# Patient Record
Sex: Female | Born: 1990 | Hispanic: No | Marital: Married | Smoking: Never smoker
Health system: Southern US, Community
[De-identification: ages and names within clinical notes are randomized; demographics above are authoritative.]

## PROBLEM LIST (undated history)

## (undated) ENCOUNTER — Inpatient Hospital Stay (HOSPITAL_COMMUNITY): Payer: Self-pay

## (undated) DIAGNOSIS — I00 Rheumatic fever without heart involvement: Secondary | ICD-10-CM

## (undated) DIAGNOSIS — D649 Anemia, unspecified: Secondary | ICD-10-CM

## (undated) HISTORY — PX: TONSILLECTOMY: SUR1361

---

## 2013-07-15 ENCOUNTER — Ambulatory Visit (INDEPENDENT_AMBULATORY_CARE_PROVIDER_SITE_OTHER): Payer: BC Managed Care – PPO | Admitting: Emergency Medicine

## 2013-07-15 VITALS — BP 104/68 | HR 68 | Temp 97.9°F | Resp 18 | Ht 67.0 in | Wt 141.0 lb

## 2013-07-15 DIAGNOSIS — S335XXA Sprain of ligaments of lumbar spine, initial encounter: Secondary | ICD-10-CM

## 2013-07-15 MED ORDER — CYCLOBENZAPRINE HCL 10 MG PO TABS: 10.0000 mg | ORAL_TABLET | Freq: Three times a day (TID) | ORAL | Status: DC | PRN

## 2013-07-15 MED ORDER — NAPROXEN SODIUM 550 MG PO TABS: 550.0000 mg | ORAL_TABLET | Freq: Two times a day (BID) | ORAL | Status: DC

## 2013-07-15 NOTE — Progress Notes (Signed)
[facility] [address], [place] [address] [phone]  Date:  07/15/2013   Name:  Kelsey LevansChaimae Monfils   DOB:  09/30/1990   MRN:  [id number]  PCP:  No primary provider on file.    Chief Complaint: Back Pain   History of Present Illness:  Kelsey LevansChaimae Carey is a 23 y.o. very pleasant female patient who presents with the following:  1 week history of low back pain across lumbar region.  No history of injury or overuse.  No radicular symptoms or neuro symptoms.  Pain worse when sitting, bending, or standing less when lays down.  No prior back injury.  No improvement with over the counter medications or other home remedies. Denies other complaint or health concern today.   There are no active problems to display for this patient.   No past medical history on file.  No past surgical history on file.  History  Substance Use Topics  . Smoking status: Never Smoker   . Smokeless tobacco: Never Used  . Alcohol Use: No    No family history on file.  No Known Allergies  Medication list has been reviewed and updated.  No current outpatient prescriptions on file prior to visit.   No current facility-administered medications on file prior to visit.    Review of Systems:  As per HPI, otherwise negative.    Physical Examination: Filed Vitals:   07/15/13 1820  BP: 104/68  Pulse: 68  Temp: 97.9 F (36.6 C)  Resp: 18   Filed Vitals:   07/15/13 1820  Height: 5\' 7"  (1.702 m)  Weight: 141 lb (63.957 kg)   Body mass index is 22.08 kg/(m^2). Ideal Body Weight: Weight in (lb) to have BMI = 25: 159.3  GEN: WDWN, NAD, Non-toxic, A & O x 3 HEENT: Atraumatic, Normocephalic. Neck supple. No masses, No LAD. Ears and Nose: No external deformity. CV: RRR, No M/G/R. No JVD. No thrill. No extra heart sounds. PULM: CTA B, no wheezes, crackles, rhonchi. No retractions. No resp. distress. No accessory muscle use. ABD: S, NT, ND, +BS. No rebound. No HSM. BACK:  Tender  bilaterally lumbar paraspinous.  Neuro intact EXTR: No c/c/e NEURO Normal gait.  PSYCH: Normally interactive. Conversant. Not depressed or anxious appearing.  Calm demeanor.    Assessment and Plan: Lumbar strain Anaprox Flexeril   Signed,  Phillips OdorJeffery Anderson, MD

## 2013-07-15 NOTE — Patient Instructions (Signed)
Lumbosacral Strain Lumbosacral strain is a strain of any of the parts that make up your lumbosacral vertebrae. Your lumbosacral vertebrae are the bones that make up the lower third of your backbone. Your lumbosacral vertebrae are held together by muscles and tough, fibrous tissue (ligaments).  CAUSES  A sudden blow to your back can cause lumbosacral strain. Also, anything that causes an excessive stretch of the muscles in the low back can cause this strain. This is typically seen when people exert themselves strenuously, fall, lift heavy objects, bend, or crouch repeatedly. RISK FACTORS  Physically demanding work.  Participation in pushing or pulling sports or sports that require sudden twist of the back (tennis, golf, baseball).  Weight lifting.  Excessive lower back curvature.  Forward-tilted pelvis.  Weak back or abdominal muscles or both.  Tight hamstrings. SIGNS AND SYMPTOMS  Lumbosacral strain may cause pain in the area of your injury or pain that moves (radiates) down your leg.  DIAGNOSIS Your health care provider can often diagnose lumbosacral strain through a physical exam. In some cases, you may need tests such as X-ray exams.  TREATMENT  Treatment for your lower back injury depends on many factors that your clinician will have to evaluate. However, most treatment will include the use of anti-inflammatory medicines. HOME CARE INSTRUCTIONS   Avoid hard physical activities (tennis, racquetball, waterskiing) if you are not in proper physical condition for it. This may aggravate or create problems.  If you have a back problem, avoid sports requiring sudden body movements. Swimming and walking are generally safer activities.  Maintain good posture.  Maintain a healthy weight.  For acute conditions, you may put ice on the injured area.  Put ice in a plastic bag.  Place a towel between your skin and the bag.  Leave the ice on for 20 minutes, 2 3 times a day.  When the  low back starts healing, stretching and strengthening exercises may be recommended. SEEK MEDICAL CARE IF:  Your back pain is getting worse.  You experience severe back pain not relieved with medicines. SEEK IMMEDIATE MEDICAL CARE IF:   You have numbness, tingling, weakness, or problems with the use of your arms or legs.  There is a change in bowel or bladder control.  You have increasing pain in any area of the body, including your belly (abdomen).  You notice shortness of breath, dizziness, or feel faint.  You feel sick to your stomach (nauseous), are throwing up (vomiting), or become sweaty.  You notice discoloration of your toes or legs, or your feet get very cold. MAKE SURE YOU:   Understand these instructions.  Will watch your condition.  Will get help right away if you are not doing well or get worse. Document Released: 01/12/2005 Document Revised: 01/23/2013 Document Reviewed: 11/21/2012 ExitCare Patient Information 2014 [employer].  

## 2013-11-20 ENCOUNTER — Inpatient Hospital Stay (HOSPITAL_COMMUNITY)
Admission: AD | Admit: 2013-11-20 | Discharge: 2013-11-20 | Disposition: A | Discharge status: Home / Self Care | Payer: BC Managed Care – PPO | Source: Ambulatory Visit | Attending: Obstetrics & Gynecology | Admitting: Obstetrics & Gynecology

## 2013-11-20 ENCOUNTER — Inpatient Hospital Stay (HOSPITAL_COMMUNITY): Payer: BC Managed Care – PPO

## 2013-11-20 ENCOUNTER — Encounter (HOSPITAL_COMMUNITY): Payer: Self-pay | Admitting: *Deleted

## 2013-11-20 DIAGNOSIS — O209 Hemorrhage in early pregnancy, unspecified: Secondary | ICD-10-CM | POA: Diagnosis not present

## 2013-11-20 DIAGNOSIS — O4690 Antepartum hemorrhage, unspecified, unspecified trimester: Secondary | ICD-10-CM | POA: Diagnosis not present

## 2013-11-20 DIAGNOSIS — R109 Unspecified abdominal pain: Secondary | ICD-10-CM | POA: Insufficient documentation

## 2013-11-20 LAB — HCG, QUANTITATIVE, PREGNANCY: hCG, Beta Chain, Quant, S: 54 m[IU]/mL — ABNORMAL HIGH (ref <5)

## 2013-11-20 LAB — CBC
HCT: 37.2 % (ref 36.0–46.0)
Hemoglobin: 12 g/dL (ref 12.0–15.0)
MCH: 25.4 pg — ABNORMAL LOW (ref 26.0–34.0)
MCHC: 32.3 g/dL (ref 30.0–36.0)
MCV: 78.8 fL (ref 78.0–100.0)
Platelets: 274 10*3/uL (ref 150–400)
RBC: 4.72 MIL/uL (ref 3.87–5.11)
RDW: 14.2 % (ref 11.5–15.5)
WBC: 10 10*3/uL (ref 4.0–10.5)

## 2013-11-20 LAB — URINALYSIS, ROUTINE W REFLEX MICROSCOPIC
BILIRUBIN URINE: NEGATIVE
Glucose, UA: NEGATIVE mg/dL
Ketones, ur: NEGATIVE mg/dL
NITRITE: NEGATIVE
Protein, ur: NEGATIVE mg/dL
Specific Gravity, Urine: 1.01 (ref 1.005–1.030)
UROBILINOGEN UA: 0.2 mg/dL (ref 0.0–1.0)
pH: 7.5 (ref 5.0–8.0)

## 2013-11-20 LAB — URINE MICROSCOPIC-ADD ON

## 2013-11-20 LAB — WET PREP, GENITAL
Clue Cells Wet Prep HPF POC: NONE SEEN
Trich, Wet Prep: NONE SEEN
Yeast Wet Prep HPF POC: NONE SEEN

## 2013-11-20 LAB — POCT PREGNANCY, URINE: Preg Test, Ur: POSITIVE — AB

## 2013-11-20 MED ORDER — OXYCODONE-ACETAMINOPHEN 5-325 MG PO TABS: 1.0000 | ORAL_TABLET | Freq: Four times a day (QID) | ORAL | Status: DC | PRN

## 2013-11-20 NOTE — MAU Note (Signed)
Pt"s husband states pt found out that she is bleeding about 3 wks ago and today she is bleeding. Pt states she is having. Pt state pain is about 9

## 2013-11-20 NOTE — Discharge Instructions (Signed)
Vaginal Bleeding During Pregnancy, First Trimester  A small amount of bleeding (spotting) from the vagina is relatively common in early pregnancy. It usually stops on its own. Various things may cause bleeding or spotting in early pregnancy. Some bleeding may be related to the pregnancy, and some may not. In most cases, the bleeding is normal and is not a problem. However, bleeding can also be a sign of something serious. Be sure to tell your health care provider about any vaginal bleeding right away.  Some possible causes of vaginal bleeding during the first trimester include:  · Infection or inflammation of the cervix.  · Growths (polyps) on the cervix.  · Miscarriage or threatened miscarriage.  · Pregnancy tissue has developed outside of the uterus and in a fallopian tube (tubal pregnancy).  · Tiny cysts have developed in the uterus instead of pregnancy tissue (molar pregnancy).  HOME CARE INSTRUCTIONS   Watch your condition for any changes. The following actions may help to lessen any discomfort you are feeling:  · Follow your health care provider's instructions for limiting your activity. If your health care provider orders bed rest, you may need to stay in bed and only get up to use the bathroom. However, your health care provider may allow you to continue light activity.  · If needed, make plans for someone to help with your regular activities and responsibilities while you are on bed rest.  · Keep track of the number of pads you use each day, how often you change pads, and how soaked (saturated) they are. Write this down.  · Do not use tampons. Do not douche.  · Do not have sexual intercourse or orgasms until approved by your health care provider.  · If you pass any tissue from your vagina, save the tissue so you can show it to your health care provider.  · Only take over-the-counter or prescription medicines as directed by your health care provider.  · Do not take aspirin because it can make you  bleed.  · Keep all follow-up appointments as directed by your health care provider.  SEEK MEDICAL CARE IF:  · You have any vaginal bleeding during any part of your pregnancy.  · You have cramps or labor pains.  · You have a fever, not controlled by medicine.  SEEK IMMEDIATE MEDICAL CARE IF:   · You have severe cramps in your back or belly (abdomen).  · You pass large clots or tissue from your vagina.  · Your bleeding increases.  · You feel light-headed or weak, or you have fainting episodes.  · You have chills.  · You are leaking fluid or have a gush of fluid from your vagina.  · You pass out while having a bowel movement.  MAKE SURE YOU:  · Understand these instructions.  · Will watch your condition.  · Will get help right away if you are not doing well or get worse.  Document Released: 01/12/2005 Document Revised: 04/09/2013 Document Reviewed: 12/10/2012  ExitCare® Patient Information ©2015 [employer]. This information is not intended to replace advice given to you by your health care provider. Make sure you discuss any questions you have with your health care provider.

## 2013-11-20 NOTE — MAU Note (Signed)
Pt had positive HPT. Started having bleeding and cramping this morning.

## 2013-11-20 NOTE — MAU Provider Note (Signed)
Chief Complaint: Vaginal Bleeding and Abdominal Pain   First Provider Initiated Contact with Patient 11/20/13 1547     SUBJECTIVE HPI: Kelsey [name] is a 23 y.o. G1P0 at [redacted]w[redacted]d by LMP who presents to maternity admissions reporting heavy vaginal bleeding and cramping starting today and positive pregnancy test 1 week ago.  Patient's last menstrual period was 10/14/2013.  She is sure of this date.  She is changing her pad every 2-3 hours with bleeding today.  She denies vaginal itching/burning, urinary symptoms, h/a, dizziness, n/v, or fever/chills.     Past Medical History  Diagnosis Date  . Medical history non-contributory    Past Surgical History  Procedure Laterality Date  . Tonsillectomy     History   Social History  . Marital Status: Married    Spouse Name: N/A    Number of Children: N/A  . Years of Education: N/A   Occupational History  . Not on file.   Social History Main Topics  . Smoking status: Never Smoker   . Smokeless tobacco: Never Used  . Alcohol Use: No  . Drug Use: No  . Sexual Activity: Not on file   Other Topics Concern  . Not on file   Social History Narrative  . No narrative on file   No current facility-administered medications on file prior to encounter.   No current outpatient prescriptions on file prior to encounter.   No Known Allergies  ROS: Pertinent items in HPI  OBJECTIVE Blood pressure 119/81, pulse 95, temperature 98.8 F (37.1 C), temperature source Oral, height 5\' 7"  (1.702 m), weight 65.862 kg (145 lb 3.2 oz), last menstrual period 10/14/2013. GENERAL: Well-developed, well-nourished female in no acute distress.  HEENT: Normocephalic HEART: normal rate RESP: normal effort ABDOMEN: Soft, non-tender EXTREMITIES: Nontender, no edema NEURO: Alert and oriented Pelvic exam: Cervix pink, visually closed, without lesion, small amount dark red bleeding with small clots, vaginal walls and external genitalia normal Bimanual exam: Cervix  0/long/high, firm, anterior, neg CMT, uterus nontender, nonenlarged, adnexa without tenderness, enlargement, or mass  LAB RESULTS Results for orders placed during the hospital encounter of 11/20/13 (from the past 24 hour(s))  URINALYSIS, ROUTINE W REFLEX MICROSCOPIC     Status: Abnormal   Collection Time    11/20/13  2:10 PM      Result Value Ref Range   Color, Urine YELLOW  YELLOW   APPearance HAZY (*) CLEAR   Specific Gravity, Urine 1.010  1.005 - 1.030   pH 7.5  5.0 - 8.0   Glucose, UA NEGATIVE  NEGATIVE mg/dL   Hgb urine dipstick LARGE (*) NEGATIVE   Bilirubin Urine NEGATIVE  NEGATIVE   Ketones, ur NEGATIVE  NEGATIVE mg/dL   Protein, ur NEGATIVE  NEGATIVE mg/dL   Urobilinogen, UA 0.2  0.0 - 1.0 mg/dL   Nitrite NEGATIVE  NEGATIVE   Leukocytes, UA TRACE (*) NEGATIVE  URINE MICROSCOPIC-ADD ON     Status: Abnormal   Collection Time    11/20/13  2:10 PM      Result Value Ref Range   Squamous Epithelial / LPF MANY (*) RARE   Urine-Other FIELD OBSCURED BY RBC'S    POCT PREGNANCY, URINE     Status: Abnormal   Collection Time    11/20/13  2:21 PM      Result Value Ref Range   Preg Test, Ur POSITIVE (*) NEGATIVE  WET PREP, GENITAL     Status: Abnormal   Collection Time    11/20/13  3:55  PM      Result Value Ref Range   Yeast Wet Prep HPF POC NONE SEEN  NONE SEEN   Trich, Wet Prep NONE SEEN  NONE SEEN   Clue Cells Wet Prep HPF POC NONE SEEN  NONE SEEN   WBC, Wet Prep HPF POC FEW (*) NONE SEEN  CBC     Status: Abnormal   Collection Time    11/20/13  4:01 PM      Result Value Ref Range   WBC 10.0  4.0 - 10.5 K/uL   RBC 4.72  3.87 - 5.11 MIL/uL   Hemoglobin 12.0  12.0 - 15.0 g/dL   HCT 16.137.2  09.636.0 - 04.546.0 %   MCV 78.8  78.0 - 100.0 fL   MCH 25.4 (*) 26.0 - 34.0 pg   MCHC 32.3  30.0 - 36.0 g/dL   RDW 40.914.2  81.111.5 - 91.415.5 %   Platelets 274  150 - 400 K/uL  HCG, QUANTITATIVE, PREGNANCY     Status: Abnormal   Collection Time    11/20/13  4:01 PM      Result Value Ref Range   hCG,  Beta Chain, Quant, S 54 (*) <5 mIU/mL    IMAGING [place] Ob Comp Less 14 Wks  11/20/2013   CLINICAL DATA:  Early pregnancy with vaginal bleeding.  EXAM: OBSTETRIC <14 WK ULTRASOUND  TECHNIQUE: Transabdominal ultrasound was performed for evaluation of the gestation as well as the maternal uterus and adnexal regions.  COMPARISON:  No priors.  FINDINGS: Intrauterine gestational sac: None.  Yolk sac:  None.  Embryo:  None.  Cardiac Activity: None.  Heart Rate: N/A  Maternal uterus/adnexae: Bilateral ovaries are normal in echotexture and appearance with multiple small follicles. No significant free fluid within the cul-de-sac.  IMPRESSION: 1. No IUP identified. 2. No acute findings.   Electronically Signed   By: Trudie Reedaniel  Entrikin M.D.   On: 11/20/2013 16:49   [place] Ob Transvaginal  11/20/2013   CLINICAL DATA:  Early pregnancy with vaginal bleeding.  EXAM: OBSTETRIC <14 WK ULTRASOUND  TECHNIQUE: Transabdominal ultrasound was performed for evaluation of the gestation as well as the maternal uterus and adnexal regions.  COMPARISON:  No priors.  FINDINGS: Intrauterine gestational sac: None.  Yolk sac:  None.  Embryo:  None.  Cardiac Activity: None.  Heart Rate: N/A  Maternal uterus/adnexae: Bilateral ovaries are normal in echotexture and appearance with multiple small follicles. No significant free fluid within the cul-de-sac.  IMPRESSION: 1. No IUP identified. 2. No acute findings.   Electronically Signed   By: Trudie Reedaniel  Entrikin M.D.   On: 11/20/2013 16:49    ASSESSMENT 1. Vaginal bleeding in pregnancy, first trimester     PLAN Discharge home with ectopic precautions Return to MAU in 48 hours for repeat quant hcg Percocet 5/325, take 1-2 tabs Q 6 hours PRN x10 tabs Return to MAU as needed for emergencies    Medication List    Notice   You have not been prescribed any medications.     Follow-up Information   [facility] In 2 days. (Return to MAU on  Friday for repeat pregnancy lab.  Return sooner as needed for emergencies. )    Contact information:   [id number] [place] [address] [phone]      Sharen CounterLisa Leftwich-Kirby Certified Nurse-Midwife 11/20/2013  5:24 PM

## 2013-11-20 NOTE — Progress Notes (Signed)
Pt states she has a headache that she rates a 7 

## 2013-11-21 LAB — GC/CHLAMYDIA PROBE AMP
CT Probe RNA: NEGATIVE
GC Probe RNA: NEGATIVE

## 2013-11-21 NOTE — MAU Provider Note (Signed)
Attestation of Attending Supervision of Advanced Practitioner (CNM/NP): Evaluation and management procedures were performed by the Advanced Practitioner under my supervision and collaboration.  I have reviewed the Advanced Practitioner's note and chart, and I agree with the management and plan.  [name], [name] 9:04 AM     

## 2013-11-22 ENCOUNTER — Inpatient Hospital Stay (HOSPITAL_COMMUNITY)
Admission: AD | Admit: 2013-11-22 | Discharge: 2013-11-22 | Disposition: A | Discharge status: Home / Self Care | Payer: BC Managed Care – PPO | Source: Ambulatory Visit | Attending: Obstetrics & Gynecology | Admitting: Obstetrics & Gynecology

## 2013-11-22 ENCOUNTER — Encounter (HOSPITAL_COMMUNITY): Payer: Self-pay

## 2013-11-22 DIAGNOSIS — O039 Complete or unspecified spontaneous abortion without complication: Secondary | ICD-10-CM | POA: Insufficient documentation

## 2013-11-22 DIAGNOSIS — R109 Unspecified abdominal pain: Secondary | ICD-10-CM | POA: Insufficient documentation

## 2013-11-22 DIAGNOSIS — M545 Low back pain, unspecified: Secondary | ICD-10-CM | POA: Insufficient documentation

## 2013-11-22 LAB — HCG, QUANTITATIVE, PREGNANCY: hCG, Beta Chain, Quant, S: 8 m[IU]/mL — ABNORMAL HIGH (ref <5)

## 2013-11-22 MED ORDER — PRENATAL PLUS 27-1 MG PO TABS
1.0000 | ORAL_TABLET | Freq: Every day | ORAL | Status: DC
Start: 2013-11-22 — End: 2014-11-10

## 2013-11-22 NOTE — Discharge Instructions (Signed)
Miscarriage A miscarriage is the sudden loss of an unborn baby (fetus) before the 20th week of pregnancy. Most miscarriages happen in the first 3 months of pregnancy. Sometimes, it happens before a woman even knows she is pregnant. A miscarriage is also called a "spontaneous miscarriage" or "early pregnancy loss." Having a miscarriage can be an emotional experience. Talk with your caregiver about any questions you may have about miscarrying, the grieving process, and your future pregnancy plans. CAUSES   Problems with the fetal chromosomes that make it impossible for the baby to develop normally. Problems with the baby's genes or chromosomes are most often the result of errors that occur, by chance, as the embryo divides and grows. The problems are not inherited from the parents.  Infection of the cervix or uterus.   Hormone problems.   Problems with the cervix, such as having an incompetent cervix. This is when the tissue in the cervix is not strong enough to hold the pregnancy.   Problems with the uterus, such as an abnormally shaped uterus, uterine fibroids, or congenital abnormalities.   Certain medical conditions.   Smoking, drinking alcohol, or taking illegal drugs.   Trauma.  Often, the cause of a miscarriage is unknown.  SYMPTOMS   Vaginal bleeding or spotting, with or without cramps or pain.  Pain or cramping in the abdomen or lower back.  Passing fluid, tissue, or blood clots from the vagina. DIAGNOSIS  Your caregiver will perform a physical exam. You may also have an ultrasound to confirm the miscarriage. Blood or urine tests may also be ordered. TREATMENT   Sometimes, treatment is not necessary if you naturally pass all the fetal tissue that was in the uterus. If some of the fetus or placenta remains in the body (incomplete miscarriage), tissue left behind may become infected and must be removed. Usually, a dilation and curettage (D and C) procedure is performed.  During a D and C procedure, the cervix is widened (dilated) and any remaining fetal or placental tissue is gently removed from the uterus.  Antibiotic medicines are prescribed if there is an infection. Other medicines may be given to reduce the size of the uterus (contract) if there is a lot of bleeding.  If you have Rh negative blood and your baby was Rh positive, you will need a Rh immunoglobulin shot. This shot will protect any future baby from having Rh blood problems in future pregnancies. HOME CARE INSTRUCTIONS   Your caregiver may order bed rest or may allow you to continue light activity. Resume activity as directed by your caregiver.  Have someone help with home and family responsibilities during this time.   Keep track of the number of sanitary pads you use each day and how soaked (saturated) they are. Write down this information.   Do not use tampons. Do not douche or have sexual intercourse until approved by your caregiver.   Only take over-the-counter or prescription medicines for pain or discomfort as directed by your caregiver.   Do not take aspirin. Aspirin can cause bleeding.   Keep all follow-up appointments with your caregiver.   If you or your partner have problems with grieving, talk to your caregiver or seek counseling to help cope with the pregnancy loss. Allow enough time to grieve before trying to get pregnant again.  SEEK IMMEDIATE MEDICAL CARE IF:   You have severe cramps or pain in your back or abdomen.  You have a fever.  You pass large blood clots (walnut-sized   or larger) ortissue from your vagina. Save any tissue for your caregiver to inspect.   Your bleeding increases.   You have a thick, bad-smelling vaginal discharge.  You become lightheaded, weak, or you faint.   You have chills.  MAKE SURE YOU:  Understand these instructions.  Will watch your condition.  Will get help right away if you are not doing well or get  worse. Document Released: 09/28/2000 Document Revised: 07/30/2012 Document Reviewed: 05/24/2011 ExitCare Patient Information 2015 [employer]. This information is not intended to replace advice given to you by your health care provider. Make sure you discuss any questions you have with your health care provider.  

## 2013-11-22 NOTE — MAU Provider Note (Signed)
Attestation of Attending Supervision of Advanced Practitioner (CNM/NP): Evaluation and management procedures were performed by the Advanced Practitioner under my supervision and collaboration. I have reviewed the Advanced Practitioner's note and chart, and I agree with the management and plan.  Taralynn Quiett H. 7:51 PM

## 2013-11-22 NOTE — MAU Provider Note (Signed)
Chief Complaint: Follow-up      SUBJECTIVE HPI: Kelsey [name] is a 23 y.o. G1P0 at [redacted]w[redacted]d by LMP who presents for F/U quant. She presented to MAU 2 days ago with a one-day history of bleeding heavier than a normal period. Since then bleeding has diminished and is pink in color. She continues to have mild lower abdominal cramping radiating to low back.   Past Medical History  Diagnosis Date  . Medical history non-contributory    OB History  Gravida Para Term Preterm AB SAB TAB Ectopic Multiple Living  1             # Outcome Date GA Lbr Len/2nd Weight Sex Delivery Anes PTL Lv  1 GRA              Comments: System Generated. Please review and update pregnancy details.     Past Surgical History  Procedure Laterality Date  . Tonsillectomy     History   Social History  . Marital Status: Married    Spouse Name: N/A    Number of Children: N/A  . Years of Education: N/A   Occupational History  . Not on file.   Social History Main Topics  . Smoking status: Never Smoker   . Smokeless tobacco: Never Used  . Alcohol Use: No  . Drug Use: No  . Sexual Activity: Not on file   Other Topics Concern  . Not on file   Social History Narrative  . No narrative on file   No current facility-administered medications on file prior to encounter.   No current outpatient prescriptions on file prior to encounter.   No Known Allergies  ROS: Pertinent items in HPI  OBJECTIVE Blood pressure 120/76, pulse 104, temperature 98.7 F (37.1 C), temperature source Oral, resp. rate 16, last menstrual period 10/14/2013, SpO2 100.00%, unknown if currently breastfeeding. GENERAL: Well-developed, well-nourished female in no acute distress.  HEENT: Normocephalic HEART: normal rate RESP: normal effort ABDOMEN: Soft, non-tender EXTREMITIES: Nontender, no edema NEURO: Alert and oriented SPECULUM EXAM: NEFG, physiologic discharge, no blood noted, cervix clean BIMANUAL: cervix ; uterus normal size, no  adnexal tenderness or masses  LAB RESULTS Results for orders placed during the hospital encounter of 11/22/13 (from the past 24 hour(s))  HCG, QUANTITATIVE, PREGNANCY     Status: Abnormal   Collection Time    11/22/13  5:47 PM      Result Value Ref Range   hCG, Beta Chain, Quant, S 8 (*) <5 mIU/mL    Ref Range 5:47 PM  2d ago      hCG, Beta Chain, Quant, S <5 mIU/mL 8 (H)    54 (H) CM                Ob Transvaginal  11/20/2013   CLINICAL DATA:  Early pregnancy with vaginal bleeding.  EXAM: OBSTETRIC <14 WK ULTRASOUND  TECHNIQUE: Transabdominal ultrasound was performed for evaluation of the gestation as well as the maternal uterus and adnexal regions.  COMPARISON:  No priors.  FINDINGS: Intrauterine gestational sac: None.  Yolk sac:  None.  Embryo:  None.  Cardiac Activity: None.  Heart Rate: N/A  Maternal uterus/adnexae: Bilateral ovaries are normal in echotexture and appearance with multiple small follicles. No significant free fluid within the cul-de-sac.  IMPRESSION: 1. No IUP identified. 2. No acute findings.   Electronically Signed   By: Trudie Reed M.D.   On: 11/20/2013 16:49    MAU COURSE  ASSESSMENT 1. SAB (spontaneous  abortion)   Probably completed very early SAB   PLAN Discharge home Declined contraception so advised to abstain or use condoms until GYN visit. Keep menstrual calendar. Take PNV.     Medication List    STOP taking these medications       oxyCODONE-acetaminophen 5-325 MG per tablet  Commonly known as:  PERCOCET/ROXICET      TAKE these medications       prenatal vitamin w/FE, FA 27-1 MG Tabs tablet  Take 1 tablet by mouth daily.       Follow-up Information   Follow up with [facility]. (Someone from Clinic will call you with appt.)    Contact information:   [address] [place] [address] [phone]         Danae OrleansDeirdre C Poe, CNM 11/22/2013  7:28 PM

## 2013-11-22 NOTE — MAU Note (Signed)
Patient to MAU for repeat BHCG. States she continues to have pink bleeding all the time and abdominal and back pain.

## 2014-02-17 ENCOUNTER — Encounter (HOSPITAL_COMMUNITY): Payer: Self-pay

## 2014-02-27 LAB — OB RESULTS CONSOLE HGB/HCT, BLOOD
HCT: 36 %
Hemoglobin: 11.5 g/dL

## 2014-02-27 LAB — OB RESULTS CONSOLE GC/CHLAMYDIA
Chlamydia: NEGATIVE
GC PROBE AMP, GENITAL: NEGATIVE

## 2014-02-27 LAB — OB RESULTS CONSOLE HEPATITIS B SURFACE ANTIGEN: HEP B S AG: NEGATIVE

## 2014-02-27 LAB — OB RESULTS CONSOLE PLATELET COUNT: Platelets: 315 10*3/uL

## 2014-02-27 LAB — OB RESULTS CONSOLE ABO/RH: RH TYPE: POSITIVE

## 2014-02-27 LAB — OB RESULTS CONSOLE ANTIBODY SCREEN: ANTIBODY SCREEN: NEGATIVE

## 2014-02-27 LAB — OB RESULTS CONSOLE RPR: RPR: NONREACTIVE

## 2014-02-27 LAB — OB RESULTS CONSOLE RUBELLA ANTIBODY, IGM: Rubella: IMMUNE

## 2014-02-27 LAB — OB RESULTS CONSOLE HIV ANTIBODY (ROUTINE TESTING): HIV: NONREACTIVE

## 2014-04-18 NOTE — L&D Delivery Note (Signed)
Vaginal Delivery Note The pt utilized an epidural as pain management.   Spontaneous rupture of membranes today, at 0220, clear.  GBS was negative.  Cervical dilation was complete at  0634.  NICHD Category 1.    Pushing with guidance began at  0724.   After 26 minutes of pushing the head, shoulders and the body of a viable female infant "S[place]on" delivered spontaneously with maternal effort in the ROA position at 0750. Loose [place] x 1, foot x1 easily reduced.  With vigorous tone and spontaneous cry, the infant was placed on moms abd.  After the umbilical cord was clamped it was cut by the FOB, then cord blood was obtained for evaluation.  Spontaneous delivery of a intact placenta with a 3 vessel cord via [place] at  [phone].   Episiotomy: None   The vulva, perineum, vaginal vault, rectum and cervix were inspected and revealed a sulcal tear which was repair using a 3-0 vicryl on a CT needle, a superficial bilateral inner labial laceration which was repaired using a 4-0 vicryl on a SH  Lidocaine was not used, the epidural was sufficient for the repair.   Postpartum pitocin as ordered.  Fundus firm, lochia minimum, bleeding under control.  EBL 300, QBL pending, Pt hemodynamically stable.   Sponge, laps and needle count correct and verified with the primary care nurse.  Attending MD available at all times.    Routine postpartum orders   Mother unsure about method of contraception  Infant to have in patient circumcision   Placenta to pathology: NO     Cord Gases sent to lab: NO Cord blood sent to lab: YES   APGARS:  9 at 1 minute and 9 at 5 minutes Weight:. 7lbs 9.8oz     Both mom and baby were left in stable condition, baby skin to skin      Kelsey [name], CNM, MSN 11/10/2014. 8:45 AM

## 2014-05-03 ENCOUNTER — Emergency Department (HOSPITAL_COMMUNITY): Payer: BLUE CROSS/BLUE SHIELD

## 2014-05-03 ENCOUNTER — Emergency Department (HOSPITAL_COMMUNITY)
Admission: EM | Admit: 2014-05-03 | Discharge: 2014-05-03 | Disposition: A | Discharge status: Home / Self Care | Payer: BLUE CROSS/BLUE SHIELD | Attending: Emergency Medicine | Admitting: Emergency Medicine

## 2014-05-03 ENCOUNTER — Encounter (HOSPITAL_COMMUNITY): Payer: Self-pay | Admitting: *Deleted

## 2014-05-03 DIAGNOSIS — O99611 Diseases of the digestive system complicating pregnancy, first trimester: Secondary | ICD-10-CM | POA: Insufficient documentation

## 2014-05-03 DIAGNOSIS — O9989 Other specified diseases and conditions complicating pregnancy, childbirth and the puerperium: Secondary | ICD-10-CM | POA: Diagnosis present

## 2014-05-03 DIAGNOSIS — K5900 Constipation, unspecified: Secondary | ICD-10-CM

## 2014-05-03 DIAGNOSIS — R12 Heartburn: Secondary | ICD-10-CM

## 2014-05-03 DIAGNOSIS — M549 Dorsalgia, unspecified: Secondary | ICD-10-CM | POA: Diagnosis not present

## 2014-05-03 DIAGNOSIS — Z8679 Personal history of other diseases of the circulatory system: Secondary | ICD-10-CM | POA: Insufficient documentation

## 2014-05-03 DIAGNOSIS — Z79899 Other long term (current) drug therapy: Secondary | ICD-10-CM | POA: Insufficient documentation

## 2014-05-03 DIAGNOSIS — Z3A13 13 weeks gestation of pregnancy: Secondary | ICD-10-CM | POA: Diagnosis not present

## 2014-05-03 DIAGNOSIS — R0789 Other chest pain: Secondary | ICD-10-CM

## 2014-05-03 DIAGNOSIS — R079 Chest pain, unspecified: Secondary | ICD-10-CM | POA: Diagnosis not present

## 2014-05-03 DIAGNOSIS — R0602 Shortness of breath: Secondary | ICD-10-CM | POA: Diagnosis not present

## 2014-05-03 DIAGNOSIS — O26891 Other specified pregnancy related conditions, first trimester: Secondary | ICD-10-CM

## 2014-05-03 HISTORY — DX: Rheumatic fever without heart involvement: I00

## 2014-05-03 LAB — CBC WITH DIFFERENTIAL/PLATELET
BASOS PCT: 0 % (ref 0–1)
Basophils Absolute: 0 10*3/uL (ref 0.0–0.1)
Eosinophils Absolute: 0.1 10*3/uL (ref 0.0–0.7)
Eosinophils Relative: 1 % (ref 0–5)
HEMATOCRIT: 34.1 % — AB (ref 36.0–46.0)
Hemoglobin: 11.2 g/dL — ABNORMAL LOW (ref 12.0–15.0)
LYMPHS PCT: 17 % (ref 12–46)
Lymphs Abs: 1.9 10*3/uL (ref 0.7–4.0)
MCH: 25.3 pg — ABNORMAL LOW (ref 26.0–34.0)
MCHC: 32.8 g/dL (ref 30.0–36.0)
MCV: 77 fL — AB (ref 78.0–100.0)
Monocytes Absolute: 0.8 10*3/uL (ref 0.1–1.0)
Monocytes Relative: 7 % (ref 3–12)
NEUTROS PCT: 75 % (ref 43–77)
Neutro Abs: 8.2 10*3/uL — ABNORMAL HIGH (ref 1.7–7.7)
Platelets: 271 10*3/uL (ref 150–400)
RBC: 4.43 MIL/uL (ref 3.87–5.11)
RDW: 14.2 % (ref 11.5–15.5)
WBC: 10.9 10*3/uL — ABNORMAL HIGH (ref 4.0–10.5)

## 2014-05-03 LAB — URINALYSIS, ROUTINE W REFLEX MICROSCOPIC
Bilirubin Urine: NEGATIVE
Glucose, UA: NEGATIVE mg/dL
Hgb urine dipstick: NEGATIVE
NITRITE: NEGATIVE
PROTEIN: NEGATIVE mg/dL
Specific Gravity, Urine: 1.007 (ref 1.005–1.030)
UROBILINOGEN UA: 0.2 mg/dL (ref 0.0–1.0)
pH: 7 (ref 5.0–8.0)

## 2014-05-03 LAB — COMPREHENSIVE METABOLIC PANEL
ALBUMIN: 4 g/dL (ref 3.5–5.2)
ALT: 13 U/L (ref 0–35)
ANION GAP: 8 (ref 5–15)
AST: 19 U/L (ref 0–37)
Alkaline Phosphatase: 53 U/L (ref 39–117)
BUN: 7 mg/dL (ref 6–23)
CALCIUM: 9.2 mg/dL (ref 8.4–10.5)
CHLORIDE: 100 meq/L (ref 96–112)
CO2: 23 mmol/L (ref 19–32)
Creatinine, Ser: 0.38 mg/dL — ABNORMAL LOW (ref 0.50–1.10)
GFR calc non Af Amer: >90 mL/min (ref >90)
GLUCOSE: 95 mg/dL (ref 70–99)
Potassium: 3.6 mmol/L (ref 3.5–5.1)
Sodium: 131 mmol/L — ABNORMAL LOW (ref 135–145)
TOTAL PROTEIN: 7.5 g/dL (ref 6.0–8.3)
Total Bilirubin: 0.9 mg/dL (ref 0.3–1.2)

## 2014-05-03 LAB — I-STAT TROPONIN, ED
Troponin i, poc: 0 ng/mL (ref 0.00–0.08)
Troponin i, poc: 0 ng/mL (ref 0.00–0.08)

## 2014-05-03 LAB — URINE MICROSCOPIC-ADD ON

## 2014-05-03 LAB — LIPASE, BLOOD: LIPASE: 23 U/L (ref 11–59)

## 2014-05-03 MED ORDER — ACETAMINOPHEN 500 MG PO TABS
500.0000 mg | ORAL_TABLET | Freq: Once | ORAL | Status: AC
Start: 2014-05-03 — End: 2014-05-03
  Administered 2014-05-03: 500 mg via ORAL
  Filled 2014-05-03: qty 1

## 2014-05-03 NOTE — ED Notes (Signed)
Pt given 16 ounces of water per PA request. Pt drinking water at present time for hydration.

## 2014-05-03 NOTE — Discharge Instructions (Signed)
Use maalox and colace to help with indigestion and constipation. Stay well hydrated. Use tylenol as needed for pain. See your OBGYN in 3 days for follow up. Return to the ER for changes or worsening symptoms.   Chest Pain (Nonspecific) It is often hard to give a diagnosis for the cause of chest pain. There is always a chance that your pain could be related to something serious, such as a heart attack or a blood clot in the lungs. You need to follow up with your doctor. HOME CARE  If antibiotic medicine was given, take it as directed by your doctor. Finish the medicine even if you start to feel better.  For the next few days, avoid activities that bring on chest pain. Continue physical activities as told by your doctor.  Do not use any tobacco products. This includes cigarettes, chewing tobacco, and e-cigarettes.  Avoid drinking alcohol.  Only take medicine as told by your doctor.  Follow your doctor's suggestions for more testing if your chest pain does not go away.  Keep all doctor visits you made. GET HELP IF:  Your chest pain does not go away, even after treatment.  You have a rash with blisters on your chest.  You have a fever. GET HELP RIGHT AWAY IF:   You have more pain or pain that spreads to your arm, neck, jaw, back, or belly (abdomen).  You have shortness of breath.  You cough more than usual or cough up blood.  You have very bad back or belly pain.  You feel sick to your stomach (nauseous) or throw up (vomit).  You have very bad weakness.  You pass out (faint).  You have chills. This is an emergency. Do not wait to see if the problems will go away. Call your local emergency services (911 in [place] Do not drive yourself to the hospital. MAKE SURE YOU:   Understand these instructions.  Will watch your condition.  Will get help right away if you are not doing well or get worse. Document Released: 09/21/2007 Document Revised: 04/09/2013 Document Reviewed:  09/21/2007 [facility] Patient Information 2015 [place], [place]. This information is not intended to replace advice given to you by your health care provider. Make sure you discuss any questions you have with your health care provider.  Constipation Constipation is when a person:  Poops (has a bowel movement) less than 3 times a week.  Has a hard time pooping.  Has poop that is dry, hard, or bigger than normal. HOME CARE   Eat foods with a lot of fiber in them. This includes fruits, vegetables, beans, and whole grains such as brown rice.  Avoid fatty foods and foods with a lot of sugar. This includes french fries, hamburgers, cookies, candy, and soda.  If you are not getting enough fiber from food, take products with added fiber in them (supplements).  Drink enough fluid to keep your pee (urine) clear or pale yellow.  Exercise on a regular basis, or as told by your doctor.  Go to the restroom when you feel like you need to poop. Do not hold it.  Only take medicine as told by your doctor. Do not take medicines that help you poop (laxatives) without talking to your doctor first. GET HELP RIGHT AWAY IF:   You have bright red blood in your poop (stool).  Your constipation lasts more than 4 days or gets worse.  You have belly (abdominal) or butt (rectal) pain.  You have thin poop (  as thin as a pencil).  You lose weight, and it cannot be explained. MAKE SURE YOU:   Understand these instructions.  Will watch your condition.  Will get help right away if you are not doing well or get worse. Document Released: 09/21/2007 Document Revised: 04/09/2013 Document Reviewed: 01/14/2013 [facility] Patient Information 2015 [place], [place]. This information is not intended to replace advice given to you by your health care provider. Make sure you discuss any questions you have with your health care provider.  Heartburn During Pregnancy  Heartburn happens when stomach acid goes up into the  esophagus. The esophagus is the tube between the mouth and the stomach. This acid causes a burning pain in the chest or throat. This happens more often in the later part of pregnancy because the womb (uterus) gets larger. It may also happen because of hormone changes. Heartburn problems often go away after giving birth. HOME CARE  Take all medicine as told by your doctor.  Raise the head of your bed with blocks only as told by your doctor.  Do not exercise right after eating.  Avoid eating 2-3 hours before bed. Do not lie down right after eating.  Eat small meals throughout the day instead of 3 large meals.  Avoid foods that give you heartburn. Foods you may want to avoid include:  Peppers.  Chocolate.  High-fat foods, including fried foods.  Spicy foods.  Garlic and onions.  Citrus fruits, including oranges, grapefruit, lemons, and limes.  Food containing tomatoes or tomato products.  Mint.  Bubbly (carbonated) drinks and drinks with caffeine.  Vinegar. GET HELP IF:  You have any belly (abdominal) pain.  You feel burning in your upper belly or chest, especially after eating or lying down.  You feel sick to your stomach (nauseous) and throw up (vomit).  Your stomach feels upset after you eat. GET HELP RIGHT AWAY IF:  You have bad chest pain that goes down your arm or into your jaw or neck.  You feel sweaty, dizzy, or light-headed.  You have trouble breathing.  You throw up blood.  You have trouble or pain when swallowing.  You have bloody or black poop (stool).  You have heartburn more than 3 times a week, for more than 2 weeks. MAKE SURE YOU:  Understand these instructions.  Will watch your condition.  Will get help right away if you are not doing well or get worse. Document Released: 05/07/2010 Document Revised: 04/09/2013 Document Reviewed: 11/21/2012 [facility] Patient Information 2015 [place], [place]. This information is not intended to replace  advice given to you by your health care provider. Make sure you discuss any questions you have with your health care provider.

## 2014-05-03 NOTE — ED Notes (Signed)
PT states that she began to have centralized chest pain that began last pm; pt states that it is "in the bones"; pt describes pain as burning; pt states pain radiates to sides of chest and back and bilateral shoulder; pt denies shortness of breath, N/V; pt states that she is [redacted] weeks pregnant

## 2014-05-03 NOTE — ED Notes (Signed)
[facility]. Pt awaiting lower extremity venous duplex bilateral at present time. Pt resting with significant other at bedside.

## 2014-05-03 NOTE — ED Notes (Signed)
Pt not in room, pt in b/r, steady gait, NAD, calm, alert.

## 2014-05-03 NOTE — Progress Notes (Signed)
VASCULAR LAB PRELIMINARY  PRELIMINARY  PRELIMINARY  PRELIMINARY  Bilateral lower extremity venous duplex  completed.    Preliminary report:  Bilateral:  No evidence of DVT, superficial thrombosis, or Baker's Cyst.    Jayona Mccaig, RVT 05/03/2014, 9:30 AM

## 2014-05-03 NOTE — ED Notes (Signed)
Pt seen by EDP prior to RN assessment, see EDPA notes, orders received and initiated.

## 2014-05-03 NOTE — ED Provider Notes (Signed)
CSN: [id number]     Arrival date & time 05/03/14  0532 History   First MD Initiated Contact with Patient 05/03/14 [phone]     Chief Complaint  Patient presents with  . Chest Pain     (Consider location/radiation/quality/duration/timing/severity/associated sxs/prior Treatment) HPI Comments: Kelsey [name] is a 24 y.o. female currently 13wks preg with a PMHx of rheumatic fever, who presents to the ED with complaints of sudden onset chest pain began at 11:30 PM last night while she was at rest. She reports the pain is 7/10 central "burning/squeezing", radiating to her sides, back, and shoulders in a circumferential manner, constant, worse with movement and laying on her side as well as with respirations, relieved slightly with laying flat on her back, and with no medications tried prior to arrival. She has never had chest pain like this before, and reports that she stopped taking medication she was taking for rheumatic fever in 2014. She has not had any issues since 2014. She cannot recall the name of the medication given that it is in [place]. Associated symptoms include shortness of breath and DOE, as well as constipation. She has been taking Colace intermittently 1 week, she has not had any bowel movements in 2 days. When asked about abdominal pain, she states "from pregnancy" and nothing new, but doesn't describe it further. Denies fevers, chills, cough, hemoptysis, LE swelling, N/V/D, obstipation, melena, hematochezia, hematuria, dysuria, vaginal bleeding/discharge, myalgias, arthralgias, weakness, paresthesias, numbness, recent travel, or hx of DVT/PE. No family hx of DVT/PE/cardiac disease.  Patient is a 24 y.o. female presenting with chest pain. The history is provided by the patient and the spouse. No language interpreter was used.  Chest Pain Pain location:  Substernal area and epigastric Pain quality: burning   Pain quality comment:  Burning squeezing Pain radiates to:  R shoulder, L shoulder,  upper back and mid back Pain radiates to the back: yes   Pain severity:  Moderate (7/10) Onset quality:  Sudden Duration:  7 hours Timing:  Constant Progression:  Unchanged Chronicity:  New Context: at rest   Relieved by:  None tried Worsened by:  Deep breathing and movement Ineffective treatments:  None tried Associated symptoms: abdominal pain ("from pregnancy"), back pain and shortness of breath   Associated symptoms: no anxiety, no claudication, no cough, no diaphoresis, no dizziness, no dysphagia, no fever, no headache, no heartburn, no lower extremity edema, no nausea, no near-syncope, no numbness, no orthopnea, no palpitations, no PND, no syncope, not vomiting and no weakness   Risk factors: pregnancy   Risk factors: no prior DVT/PE     Past Medical History  Diagnosis Date  . Medical history non-contributory   . Rheumatic fever    Past Surgical History  Procedure Laterality Date  . Tonsillectomy     No family history on file. History  Substance Use Topics  . Smoking status: Never Smoker   . Smokeless tobacco: Never Used  . Alcohol Use: No   OB History    Gravida Para Term Preterm AB TAB SAB Ectopic Multiple Living   2              Review of Systems  Constitutional: Negative for fever, chills and diaphoresis.  HENT: Negative for trouble swallowing.   Respiratory: Positive for shortness of breath. Negative for cough and wheezing.   Cardiovascular: Positive for chest pain. Negative for palpitations, orthopnea, claudication, leg swelling, syncope, PND and near-syncope.  Gastrointestinal: Positive for abdominal pain ("from pregnancy") and constipation. Negative  for heartburn, nausea, vomiting, diarrhea and blood in stool.  Genitourinary: Negative for dysuria, frequency, hematuria, flank pain, vaginal bleeding and vaginal discharge.  Musculoskeletal: Positive for back pain. Negative for myalgias and arthralgias.  Neurological: Negative for dizziness, weakness,  light-headedness, numbness and headaches.   10 Systems reviewed and are negative for acute change except as noted in the HPI.    Allergies  Review of patient's allergies indicates no known allergies.  Home Medications   Prior to Admission medications   Medication Sig Start Date End Date Taking? Authorizing Provider  prenatal vitamin w/FE, FA (PRENATAL 1 + 1) 27-1 MG TABS tablet Take 1 tablet by mouth daily. 11/22/13   Deirdre C Poe, CNM   BP 120/79 mmHg  Pulse 111  Temp(Src) 97.9 F (36.6 C) (Oral)  Resp 20  SpO2 99%  LMP 01/29/2014 Physical Exam  Constitutional: She is oriented to person, place, and time. Vital signs are normal. She appears well-developed and well-nourished.  Non-toxic appearance. No distress.  Afebrile, nontoxic, NAD, initially tachycardic but on exam HR ~95bpm  HENT:  Head: Normocephalic and atraumatic.  Mouth/Throat: Oropharynx is clear and moist and mucous membranes are normal.  Eyes: Conjunctivae and EOM are normal. Right eye exhibits no discharge. Left eye exhibits no discharge.  Neck: Normal range of motion. Neck supple. No JVD present.  Cardiovascular: Normal rate, regular rhythm, normal heart sounds and intact distal pulses.  Exam reveals no gallop and no friction rub.   No murmur heard. RRR, nl s1/s2, no m/r/g, distal pulses intact, no pedal edema. Initially tachycardic in triage which resolved during exam, HR ~95  Pulmonary/Chest: Effort normal and breath sounds normal. No respiratory distress. She has no decreased breath sounds. She has no wheezes. She has no rhonchi. She has no rales. She exhibits tenderness. She exhibits no crepitus and no deformity.    CTAB in all lung fields, no w/r/r, no increased WOB, no hypoxia, speaking in full sentences Mild TTP along anterior chest wall centrally, no crepitus or deformity  Abdominal: Soft. Normal appearance and bowel sounds are normal. She exhibits no distension. There is tenderness in the epigastric area.  There is no rigidity, no rebound, no guarding, no CVA tenderness, no tenderness at McBurney's point and negative Murphy's sign.    Soft, ND, +BS throughout, with mild epigastric TTP, no r/g/r, neg murphy's, neg mcburney's, no CVA TTP, gravid abd with fundus palpable above pubic symphysis but below umbilicus  Musculoskeletal: Normal range of motion.  Distal pulses intact, no pedal edema, neg homan's bilaterally  Neurological: She is alert and oriented to person, place, and time. She has normal strength. No sensory deficit.  Skin: Skin is warm, dry and intact. No rash noted.  Psychiatric: She has a normal mood and affect.  Nursing note and vitals reviewed.   ED Course  Procedures (including critical care time) Labs Review Labs Reviewed  URINALYSIS, ROUTINE W REFLEX MICROSCOPIC - Abnormal; Notable for the following:    Ketones, ur >80 (*)    Leukocytes, UA MODERATE (*)    All other components within normal limits  CBC WITH DIFFERENTIAL - Abnormal; Notable for the following:    WBC 10.9 (*)    Hemoglobin 11.2 (*)    HCT 34.1 (*)    MCV 77.0 (*)    MCH 25.3 (*)    Neutro Abs 8.2 (*)    All other components within normal limits  COMPREHENSIVE METABOLIC PANEL - Abnormal; Notable for the following:    Sodium 131 (*)  Creatinine, Ser 0.38 (*)    All other components within normal limits  URINE MICROSCOPIC-ADD ON - Abnormal; Notable for the following:    Squamous Epithelial / LPF FEW (*)    All other components within normal limits  LIPASE, BLOOD  I-STAT TROPOININ, ED  I-STAT TROPOININ, ED    Imaging Review Dg Chest 2 View  05/03/2014   CLINICAL DATA:  Diffuse chest pain radiating to the upper back with shortness of breath since last night. Thirteen weeks pregnant.  EXAM: CHEST  2 VIEW  COMPARISON:  None.  FINDINGS: Normal sized heart. Clear lungs with normal vascularity. Normal appearing bones.  IMPRESSION: Normal examination.   Electronically Signed   By: Gordan Payment M.D.   On:  05/03/2014 07:42     EKG Interpretation   Date/Time:  Saturday May 03 2014 05:56:57 EST Ventricular Rate:  112 PR Interval:  153 QRS Duration: 90 QT Interval:  319 QTC Calculation: 435 R Axis:   81 Text Interpretation:  Sinus tachycardia Borderline T abnormalities,  inferior leads Baseline wander in lead(s) V5 Confirmed by YELVERTON  MD,  DAVID ([id number]) on 05/03/2014 7:10:50 AM      MDM   Final diagnoses:  Chest pain  Constipation, unspecified constipation type  Heartburn during pregnancy, first trimester  Atypical chest pain    24 y.o. female currently 13wks preg, with CP radiating to her back, worse with inspiration. Although CP is reproducible with palpation, given her current pregnancy, PE is higher on differential. Pt was initially tachycardic, but resolved after being at rest prior to exam. EKG showing sinus tachy (taken in triage). No hypoxia or s/sx of DVT. Will obtain labs, give tylenol, and get CXR and b/l duplex u/s to r/o DVT. If no DVT, less likely for PE. Will reassess shortly.  10:30 AM U/A with slight dehydration, doesn't appear infected. Trop neg. CBC w/diff with mild anemia likely due to pregnancy. Lipase neg. CMP with mildly low Na but unconcerning. Pt encouraged to drink due to some slightly low BPs, but currently BP 100/69 and HR 89. CXR neg. EKG initially showed sinus tachy which since resolved. Duplex U/S of b/l LEs was neg for DVT therefore risk of PE much less. Pt is due for second troponin therefore for peace of mind will repeat now, if neg then I feel safe discharging home. This is likely more related to GERD or gas pain from constipation. Discussed use of colace and maalox. Discussed importance of oral hydration at home especially during pregnancy. Strict return precautions for PE/worrisome CP given.   11:06 AM Second trop neg. No ongoing CP reported. I explained the diagnosis and have given explicit precautions to return to the ER including for any  other new or worsening symptoms. The patient understands and accepts the medical plan as it's been dictated and I have answered their questions. Discharge instructions concerning home care and prescriptions have been given. The patient is STABLE and is discharged to home in good condition.   BP 100/69 mmHg  Pulse 89  Temp(Src) 98.4 F (36.9 C) (Oral)  Resp 18  SpO2 100%  LMP 01/29/2014  Meds ordered this encounter  Medications  . acetaminophen (TYLENOL) tablet 500 mg    Sig:      [place], PA-C 05/03/14 1107  Loren Racer, MD 05/04/14 [phone]

## 2014-05-03 NOTE — ED Notes (Signed)
Lower extremity venous duplex bilateral technician at bedside.

## 2014-09-10 ENCOUNTER — Encounter (HOSPITAL_COMMUNITY): Payer: Self-pay | Admitting: *Deleted

## 2014-09-10 ENCOUNTER — Inpatient Hospital Stay (HOSPITAL_COMMUNITY)
Admission: AD | Admit: 2014-09-10 | Discharge: 2014-09-10 | Disposition: A | Discharge status: Home / Self Care | Payer: BLUE CROSS/BLUE SHIELD | Source: Ambulatory Visit | Attending: Obstetrics & Gynecology | Admitting: Obstetrics & Gynecology

## 2014-09-10 DIAGNOSIS — Z3A32 32 weeks gestation of pregnancy: Secondary | ICD-10-CM | POA: Insufficient documentation

## 2014-09-10 DIAGNOSIS — Z8679 Personal history of other diseases of the circulatory system: Secondary | ICD-10-CM | POA: Diagnosis not present

## 2014-09-10 HISTORY — DX: Anemia, unspecified: D64.9

## 2014-09-10 NOTE — MAU Note (Signed)
Sent from office with abdominal pain, preterm contractions. FFN pending from office.

## 2014-09-10 NOTE — MAU Provider Note (Signed)
  History   24 yo G2P0010 at 32 weeks presented from office for extended monitoring and possible Procardia due to UCs on NST in office (6 in 30 min).  Cervix closed, 70%, vtx, -2.  FFN done at office around 3:48p--sent to lab.  Scheduled for US for [facility] tomorrow due to S<D.  Patient speaks Arabic--husband serves as Nurse, learning disabilitytranslator.  Patient Active Problem List   Diagnosis Date Noted  . Hx of rheumatic fever 09/10/2014    Chief Complaint  Patient presents with  . Contractions   HPI:  As above  OB History    Gravida Para Term Preterm AB TAB SAB Ectopic Multiple Living   2    1  1          Past Medical History  Diagnosis Date  . Rheumatic fever   . Anemia     Past Surgical History  Procedure Laterality Date  . Tonsillectomy      History reviewed. No pertinent family history.  History  Substance Use Topics  . Smoking status: Never Smoker   . Smokeless tobacco: Never Used  . Alcohol Use: No    Allergies: No Known Allergies  Prescriptions prior to admission  Medication Sig Dispense Refill Last Dose  . docusate sodium (COLACE) 100 MG capsule Take 100 mg by mouth daily.   05/02/2014 at Unknown time  . Doxylamine-Pyridoxine 10-10 MG TBEC Take 2 tablets by mouth at bedtime.   05/02/2014 at Unknown time  . prenatal vitamin w/FE, FA (PRENATAL 1 + 1) 27-1 MG TABS tablet Take 1 tablet by mouth daily. 30 each 0 05/02/2014 at Unknown time    ROS:  Cramping, +FM Physical Exam   Blood pressure 108/72, pulse 107, temperature 98.2 F (36.8 C), temperature source Oral, resp. rate 18, height 5' 6.5" (1.689 m), weight 68.04 kg (150 lb), last menstrual period 01/29/2014, unknown if currently breastfeeding.    Physical Exam  In NAD Chest clear Heart RRR without murmur Abd gravid, NT Pelvic--deferred at present Ext WNL  FHR Category 1 UCs--single UC on tracing, very mild.  ED Course  Assessment: IUP at 32 weeks PT UCs in office FFN pending  Plan:  Await FFN--if positive,  will give betamethasone course. If more UCs noted, Procardia regimen prn.   Nigel BridgemanLATHAM, Terron Merfeld CNM, MSN 09/10/2014 5:43 PM  Addendum: FFN resulted from [facility] = Negative  D/c'd home undelivered. PTL precautions reviewed. Will push po fluids and rest. [facility]  Nigel BridgemanVicki Cameo Shewell, CNM 09/10/14 6:30p

## 2014-09-10 NOTE — Discharge Instructions (Signed)
Preterm Labor Information Preterm labor is when labor starts before you are [redacted] weeks pregnant. The normal length of pregnancy is 39 to 41 weeks.  CAUSES  The cause of preterm labor is not often known. The most common known cause is infection. RISK FACTORS  Having a history of preterm labor.  Having your water break before it should.  Having a placenta that covers the opening of the cervix.  Having a placenta that breaks away from the uterus.  Having a cervix that is too weak to hold the baby in the uterus.  Having too much fluid in the amniotic sac.  Taking drugs or smoking while pregnant.  Not gaining enough weight while pregnant.  Being younger than 18 and older than 22 years old.  Having a low income.  Being African American. SYMPTOMS  Period-like cramps, belly (abdominal) pain, or back pain.  Contractions that are regular, as often as six in an hour. They may be mild or painful.  Contractions that start at the top of the belly. They then move to the lower belly and back.  Lower belly pressure that seems to get stronger.  Bleeding from the vagina.  Fluid leaking from the vagina. TREATMENT  Treatment depends on:  Your condition.  The condition of your baby.  How many weeks pregnant you are. Your doctor may have you:  Take medicine to stop contractions.  Stay in bed except to use the restroom (bed rest).  Stay in the hospital. WHAT SHOULD YOU DO IF YOU THINK YOU ARE IN PRETERM LABOR? Call your doctor right away. You need to go to the hospital right away.  HOW CAN YOU PREVENT PRETERM LABOR IN FUTURE PREGNANCIES?  Stop smoking, if you smoke.  Maintain healthy weight gain.  Do not take drugs or be around chemicals that are not needed.  Tell your doctor if you think you have an infection.  Tell your doctor if you had a preterm labor before. Document Released: 07/01/2008 Document Revised: 01/23/2013 Document Reviewed: 07/01/2008 [employer] Patient  Information 2015 [employer]. This information is not intended to replace advice given to you by your health care provider. Make sure you discuss any questions you have with your health care provider.  

## 2014-09-10 NOTE — Progress Notes (Signed)
Patient states since arrival to [facility]U, she has felt only a few "small" contractions.

## 2014-09-10 NOTE — MAU Note (Signed)
Urine in lab 

## 2014-09-10 NOTE — Progress Notes (Signed)
FFN from office negative per V. Emilee HeroLatham, CNM

## 2014-10-07 LAB — OB RESULTS CONSOLE GBS: GBS: NEGATIVE

## 2014-11-09 ENCOUNTER — Inpatient Hospital Stay (HOSPITAL_COMMUNITY)
Admission: AD | Admit: 2014-11-09 | Discharge: 2014-11-12 | DRG: 775 | Disposition: A | Discharge status: Home / Self Care | Payer: BLUE CROSS/BLUE SHIELD | Source: Ambulatory Visit | Attending: Obstetrics and Gynecology | Admitting: Obstetrics and Gynecology

## 2014-11-09 ENCOUNTER — Inpatient Hospital Stay (HOSPITAL_COMMUNITY)
Admission: AD | Admit: 2014-11-09 | Discharge: 2014-11-09 | Disposition: A | Discharge status: Home / Self Care | Payer: BLUE CROSS/BLUE SHIELD | Source: Ambulatory Visit | Attending: Obstetrics & Gynecology | Admitting: Obstetrics & Gynecology

## 2014-11-09 ENCOUNTER — Encounter (HOSPITAL_COMMUNITY): Payer: Self-pay | Admitting: *Deleted

## 2014-11-09 DIAGNOSIS — Z3A40 40 weeks gestation of pregnancy: Secondary | ICD-10-CM | POA: Diagnosis present

## 2014-11-09 DIAGNOSIS — O479 False labor, unspecified: Secondary | ICD-10-CM

## 2014-11-09 MED ORDER — ZOLPIDEM TARTRATE 5 MG PO TABS
5.0000 mg | ORAL_TABLET | Freq: Once | ORAL | Status: AC
  Administered 2014-11-09: 5 mg via ORAL
  Filled 2014-11-09: qty 1

## 2014-11-09 MED ORDER — MORPHINE SULFATE 4 MG/ML IJ SOLN
4.0000 mg | Freq: Once | INTRAMUSCULAR | Status: AC
  Administered 2014-11-09: 4 mg via INTRAMUSCULAR
  Filled 2014-11-09: qty 1

## 2014-11-09 NOTE — MAU Note (Signed)
Pt reports contractions since 0900. Contractions are 3 minutes apart per Pt report

## 2014-11-09 NOTE — MAU Provider Note (Signed)
History    Kelsey [name] is a 24 y.o. G2P0 at 40.4wks who presents for contractions  Patient states that she has been contracting for "10 hours."  Patient reports contractions 7-8/10, but desires unmedicated child birth.  Patient reports active fetus and denies LOF and VB.  Patient reports office exam on Tuesday of 1cm.    Patient Active Problem List   Diagnosis Date Noted  . Hx of rheumatic fever 09/10/2014    Chief Complaint  Patient presents with  . Labor Eval   HPI  OB History    Gravida Para Term Preterm AB TAB SAB Ectopic Multiple Living   Past Medical History  Diagnosis Date  . Rheumatic fever   . Anemia     Past Surgical History  Procedure Laterality Date  . Tonsillectomy      No family history on file.  History  Substance Use Topics  . Smoking status: Never Smoker   . Smokeless tobacco: Never Used  . Alcohol Use: No    Allergies: No Known Allergies  Prescriptions prior to admission  Medication Sig Dispense Refill Last Dose  . docusate sodium (COLACE) 100 MG capsule Take 100 mg by mouth daily.   11/09/2014 at Unknown time  . prenatal vitamin w/FE, FA (PRENATAL 1 + 1) 27-1 MG TABS tablet Take 1 tablet by mouth daily. (Patient not taking: Reported on 09/10/2014) 30 each 0 Not Taking at Unknown time    ROS  See HPI Above Physical Exam   Last menstrual period 01/29/2014, unknown if currently breastfeeding.  No results found for this or any previous visit (from the past 24 hour(s)).  Physical Exam  SVE: 1/50/-2, Medium, Middle, No bloody show  FHR:150 bpm, Mod Var, -Decels, +Accels UC: Q3-64min, palpates moderate ED Course  Assessment: IUP at 40.4wks Cat I FT Contractions  Plan: -VE no change from office -Desires un medicated childbirth -Discharge to home with precations -Nurse comes reports patient requests pain meds and sleep aide -Therapeutic Rest Ordered -Continue with discharge plan Encouraged to call if any questions or  concerns arise prior to next scheduled office visit.    Galvin Aversa LYNN CNM, MSN 11/09/2014 8:01 PM

## 2014-11-09 NOTE — MAU Note (Signed)
Pt returning to MAU for stronger uc's

## 2014-11-09 NOTE — Discharge Instructions (Signed)

## 2014-11-10 ENCOUNTER — Encounter (HOSPITAL_COMMUNITY): Payer: Self-pay | Admitting: *Deleted

## 2014-11-10 ENCOUNTER — Inpatient Hospital Stay (HOSPITAL_COMMUNITY): Payer: BLUE CROSS/BLUE SHIELD | Admitting: Anesthesiology

## 2014-11-10 DIAGNOSIS — Z3A40 40 weeks gestation of pregnancy: Secondary | ICD-10-CM | POA: Diagnosis present

## 2014-11-10 LAB — CBC
HEMATOCRIT: 32.2 % — AB (ref 36.0–46.0)
HEMOGLOBIN: 10.3 g/dL — AB (ref 12.0–15.0)
MCH: 23.6 pg — ABNORMAL LOW (ref 26.0–34.0)
MCHC: 32 g/dL (ref 30.0–36.0)
MCV: 73.9 fL — AB (ref 78.0–100.0)
PLATELETS: 280 10*3/uL (ref 150–400)
RBC: 4.36 MIL/uL (ref 3.87–5.11)
RDW: 19.2 % — AB (ref 11.5–15.5)
WBC: 13.5 10*3/uL — ABNORMAL HIGH (ref 4.0–10.5)

## 2014-11-10 LAB — TYPE AND SCREEN
ABO/RH(D): AB POS
ANTIBODY SCREEN: NEGATIVE

## 2014-11-10 LAB — HIV ANTIBODY (ROUTINE TESTING W REFLEX): HIV SCREEN 4TH GENERATION: NONREACTIVE

## 2014-11-10 LAB — ABO/RH: ABO/RH(D): AB POS

## 2014-11-10 LAB — RPR: RPR Ser Ql: NONREACTIVE

## 2014-11-10 MED ORDER — OXYCODONE-ACETAMINOPHEN 5-325 MG PO TABS: 2.0000 | ORAL_TABLET | ORAL | Status: DC | PRN

## 2014-11-10 MED ORDER — OXYTOCIN 40 UNITS IN LACTATED RINGERS INFUSION - SIMPLE MED
62.5000 mL/h | INTRAVENOUS | Status: DC
  Administered 2014-11-10: 62.5 mL/h via INTRAVENOUS
  Filled 2014-11-10: qty 1000

## 2014-11-10 MED ORDER — PRENATAL MULTIVITAMIN CH
1.0000 | ORAL_TABLET | Freq: Every day | ORAL | Status: DC
  Administered 2014-11-10 – 2014-11-12 (×3): 1 via ORAL
  Filled 2014-11-10 (×3): qty 1

## 2014-11-10 MED ORDER — BENZOCAINE-MENTHOL 20-0.5 % EX AERO
1.0000 "application " | INHALATION_SPRAY | CUTANEOUS | Status: DC | PRN
  Administered 2014-11-11 – 2014-11-12 (×2): 1 via TOPICAL
  Filled 2014-11-10 (×2): qty 56

## 2014-11-10 MED ORDER — ZOLPIDEM TARTRATE 5 MG PO TABS: 5.0000 mg | ORAL_TABLET | Freq: Every evening | ORAL | Status: DC | PRN

## 2014-11-10 MED ORDER — SENNOSIDES-DOCUSATE SODIUM 8.6-50 MG PO TABS
2.0000 | ORAL_TABLET | ORAL | Status: DC
  Administered 2014-11-11 (×2): 2 via ORAL
  Filled 2014-11-10 (×2): qty 2

## 2014-11-10 MED ORDER — OXYTOCIN BOLUS FROM INFUSION: 500.0000 mL | INTRAVENOUS | Status: DC

## 2014-11-10 MED ORDER — CITRIC ACID-SODIUM CITRATE 334-500 MG/5ML PO SOLN: 30.0000 mL | ORAL | Status: DC | PRN

## 2014-11-10 MED ORDER — LANOLIN HYDROUS EX OINT: TOPICAL_OINTMENT | CUTANEOUS | Status: DC | PRN

## 2014-11-10 MED ORDER — SIMETHICONE 80 MG PO CHEW: 80.0000 mg | CHEWABLE_TABLET | ORAL | Status: DC | PRN

## 2014-11-10 MED ORDER — OXYCODONE-ACETAMINOPHEN 5-325 MG PO TABS: 1.0000 | ORAL_TABLET | ORAL | Status: DC | PRN

## 2014-11-10 MED ORDER — ONDANSETRON HCL 4 MG PO TABS: 4.0000 mg | ORAL_TABLET | ORAL | Status: DC | PRN

## 2014-11-10 MED ORDER — NALBUPHINE HCL 10 MG/ML IJ SOLN
10.0000 mg | Freq: Once | INTRAMUSCULAR | Status: AC
  Administered 2014-11-10: 10 mg via INTRAMUSCULAR
  Filled 2014-11-10: qty 1

## 2014-11-10 MED ORDER — FENTANYL CITRATE (PF) 100 MCG/2ML IJ SOLN: 50.0000 ug | INTRAMUSCULAR | Status: DC | PRN

## 2014-11-10 MED ORDER — ONDANSETRON HCL 4 MG/2ML IJ SOLN: 4.0000 mg | INTRAMUSCULAR | Status: DC | PRN

## 2014-11-10 MED ORDER — LIDOCAINE HCL (PF) 1 % IJ SOLN
30.0000 mL | INTRAMUSCULAR | Status: DC | PRN
  Filled 2014-11-10: qty 30

## 2014-11-10 MED ORDER — DIPHENHYDRAMINE HCL 25 MG PO CAPS: 25.0000 mg | ORAL_CAPSULE | Freq: Four times a day (QID) | ORAL | Status: DC | PRN

## 2014-11-10 MED ORDER — DIPHENHYDRAMINE HCL 50 MG/ML IJ SOLN: 12.5000 mg | INTRAMUSCULAR | Status: DC | PRN

## 2014-11-10 MED ORDER — LIDOCAINE HCL (PF) 1 % IJ SOLN
INTRAMUSCULAR | Status: DC | PRN
  Administered 2014-11-10 (×2): 4 mL via EPIDURAL
  Administered 2014-11-10: 2 mL via EPIDURAL

## 2014-11-10 MED ORDER — LACTATED RINGERS IV SOLN
INTRAVENOUS | Status: DC
  Administered 2014-11-10 (×2): via INTRAVENOUS

## 2014-11-10 MED ORDER — WITCH HAZEL-GLYCERIN EX PADS: 1.0000 "application " | MEDICATED_PAD | CUTANEOUS | Status: DC | PRN

## 2014-11-10 MED ORDER — ACETAMINOPHEN 325 MG PO TABS
650.0000 mg | ORAL_TABLET | ORAL | Status: DC | PRN
  Administered 2014-11-11: 650 mg via ORAL
  Filled 2014-11-10: qty 2

## 2014-11-10 MED ORDER — ONDANSETRON HCL 4 MG/2ML IJ SOLN: 4.0000 mg | Freq: Four times a day (QID) | INTRAMUSCULAR | Status: DC | PRN

## 2014-11-10 MED ORDER — EPHEDRINE 5 MG/ML INJ
10.0000 mg | INTRAVENOUS | Status: DC | PRN
  Filled 2014-11-10: qty 2

## 2014-11-10 MED ORDER — LACTATED RINGERS IV SOLN
500.0000 mL | INTRAVENOUS | Status: DC | PRN
  Administered 2014-11-10: 500 mL via INTRAVENOUS

## 2014-11-10 MED ORDER — FENTANYL 2.5 MCG/ML BUPIVACAINE 1/10 % EPIDURAL INFUSION (WH - ANES)
14.0000 mL/h | INTRAMUSCULAR | Status: DC | PRN
  Administered 2014-11-10 (×2): 14 mL/h via EPIDURAL
  Filled 2014-11-10: qty 125

## 2014-11-10 MED ORDER — PHENYLEPHRINE 40 MCG/ML (10ML) SYRINGE FOR IV PUSH (FOR BLOOD PRESSURE SUPPORT)
80.0000 ug | PREFILLED_SYRINGE | INTRAVENOUS | Status: DC | PRN
  Filled 2014-11-10: qty 2
  Filled 2014-11-10: qty 20

## 2014-11-10 MED ORDER — ACETAMINOPHEN 325 MG PO TABS: 650.0000 mg | ORAL_TABLET | ORAL | Status: DC | PRN

## 2014-11-10 MED ORDER — DIBUCAINE 1 % RE OINT: 1.0000 "application " | TOPICAL_OINTMENT | RECTAL | Status: DC | PRN

## 2014-11-10 MED ORDER — TETANUS-DIPHTH-ACELL PERTUSSIS 5-2.5-18.5 LF-MCG/0.5 IM SUSP
0.5000 mL | Freq: Once | INTRAMUSCULAR | Status: AC
  Administered 2014-11-12: 0.5 mL via INTRAMUSCULAR
  Filled 2014-11-10: qty 0.5

## 2014-11-10 MED ORDER — IBUPROFEN 600 MG PO TABS
600.0000 mg | ORAL_TABLET | Freq: Four times a day (QID) | ORAL | Status: DC
  Administered 2014-11-10 – 2014-11-12 (×9): 600 mg via ORAL
  Filled 2014-11-10 (×9): qty 1

## 2014-11-10 NOTE — Anesthesia Postprocedure Evaluation (Signed)
  Anesthesia Post-op Note  Patient: Kelsey [name]  Procedure(s) Performed: * No procedures listed *  Patient Location: PACU and Mother/Baby  Anesthesia Type:Epidural  Level of Consciousness: awake, alert  and oriented  Airway and Oxygen Therapy: Patient Spontanous Breathing  Post-op Pain: none  Post-op Assessment: Post-op Vital signs reviewed and Patient's Cardiovascular Status Stable              Post-op Vital Signs: Reviewed and stable  Last Vitals:  Filed Vitals:   11/10/14 1601  BP: 106/69  Pulse: 87  Temp: 37.1 C  Resp: 16    Complications: No apparent anesthesia complications

## 2014-11-10 NOTE — H&P (Signed)
Kelsey [name] is a 24 y.o. female, G2P0 at 40.5wks weeks, presenting for contractions.  Patient seen in MAU earlier and was discharged in early labor.  Patient returns reporting increased contraction intensity and desire for epidural.  Patient is GBS negative and speaks Arabic and some [place].  Husband, at bedside, and acts as Hydrologist.   Patient Active Problem List   Diagnosis Date Noted  . Hx of rheumatic fever 09/10/2014    History of present pregnancy: Patient entered care at 7 weeks.   EDC of 11/05/2014 was established by Definite LMP of 01/29/2014 and confirmed with 7.3wk [place] on 03/19/2014 Anatomy scan:  24.6 weeks, with normal findings and an posterior placenta.   Additional [place] evaluations:   -Dating: VIABLE IUP AT 7 W 3 D -Anatomy: Vertex, posterior placenta, No previa, normal fluid, normal cord insertion not seen=profile, NB, orbits, face and heart  -27.6wks: FU ANATOMY WNL -32.1wks: Growth U/S for S<D: EFW 1980g, linear growth, AFI 13 cm Significant prenatal events:  1st Trimester: Bleeding while performing daily activities. C/O heartburn and nausea/vomiting.  2nd Trimester: Patient with unexpected death of father around 60wks and was in [place] for approximately 9wks. 3rd Trimester: Pt c/o back pain, abdominal pain, and bilateral arm pain. Reports cramping with menstrual type pain radiating to her back lasting hours.  Given procardia and fFN was negative. Pt reports intermittent "electricity" bilaterally in legs, but primarily L leg x 3 weeks and intermittent pelvic and back pain Last evaluation:  11/04/2014   39 wks 6 days   By Dr. Laymond [name]  FHR: 140  SVE: 0/0/-3   BP: 96/56  Wt: 156lbs  OB History    Gravida Para Term Preterm AB TAB SAB Ectopic Multiple Living   2    1  1         Past Medical History  Diagnosis Date  . Rheumatic fever   . Anemia    Past Surgical History  Procedure Laterality Date  . Tonsillectomy     Family History: family history is not on  file. Social History:  reports that she has never smoked. She has never used smokeless tobacco. She reports that she does not drink alcohol or use illicit drugs.   Prenatal Transfer Tool  Maternal Diabetes: No Genetic Screening: Declined Maternal Ultrasounds/Referrals: Normal Fetal Ultrasounds or other Referrals:  None Maternal Substance Abuse:  No Significant Maternal Medications:  None Significant Maternal Lab Results: Lab values include: Group B Strep negative    ROS:  +Ctx, +FM, +VB, +LOF  No Known Allergies   Dilation: 4.5 Effacement (%): 70 Station: -2 Exam by:: Kelsey [name],CNM Blood pressure 106/80, pulse 115, temperature 97.7 F (36.5 C), temperature source Oral, resp. rate 20, last menstrual period 01/29/2014, unknown if currently breastfeeding.  Physical Exam  Vitals reviewed. Constitutional: She is oriented to person, place, and time. She appears well-developed and well-nourished.  HENT:  Head: Normocephalic and atraumatic.  Eyes: EOM are normal. Pupils are equal, round, and reactive to light.  Neck: Normal range of motion.  Cardiovascular: Normal rate.   Respiratory: Effort normal.  GI: Soft.  Musculoskeletal: Normal range of motion.  Neurological: She is alert and oriented to person, place, and time.  Skin: Skin is warm and dry.     FHR: 135 bpm, Mod Var, -Decels, +Accels UCs:  Q2-59min, palpates moderate  Prenatal labs: ABO, Rh: --/--/AB POS (07/25 0145) Antibody: NEG (07/25 0145) Rubella:    Immune RPR: Nonreactive (11/12 0000)  HBsAg: Negative (11/12 0000)  HIV: Non-reactive (  11/12 0000)  GBS: Negative (06/21 0000) Sickle cell/Hgb electrophoresis:  N/A Pap: ASCUS w/o HPV 11/2013 GC:  Negative Chlamydia:  Negative Other:  None    Assessment IUP at 40.5wks Cat I FT Active Labor GBS Negative  Plan: Admit to [facility]  Routine Labor and Delivery Orders per [facility] Protocol In room to complete assessment and discuss POC: -Okay for  epidural Dr.EV to be updated as appropriate      Kelsey [name] LYNNCNM, MSN 11/10/2014, 1:42 AM

## 2014-11-10 NOTE — Progress Notes (Signed)
Kelsey [name] MRN: [id number]  Subjective: -Patient resting in bed.  Reports comfort s/p epidural.  Reports some rectal pressure.   Objective: BP 122/78 mmHg  Pulse 129  Temp(Src) 98.2 F (36.8 C) (Oral)  Resp 18  Ht  (1.702 m)  Wt 69.854 kg (154 lb)  BMI 24.11 kg/m2  LMP 01/29/2014     FHT: 150 bpm, Mod Var, -Decels, +Accels UC: Q1-35min, palpates moderate   SVE:   Dilation: 10 Effacement (%): 100 Station: +3 Exam by:: Jamarie Joplin, CNM Membranes:AROM x 4hrs Pitocin:None  Assessment:  IUP at 40.5wks Cat I FT  2nd Stage Labor  Plan: -Will set up room for delivery -Continue other mgmt as ordered -Anticipate SVD  Kelsey Chery LYNN,MSN, CNM 11/10/2014, 6:46 AM

## 2014-11-10 NOTE — Lactation Note (Addendum)
This note was copied from the chart of Boy Merikay Heatherly. Lactation Consultation Note  Patient Name: Boy Kyrstan Gotwalt [id number] Date: 11/10/2014 Reason for consult: Initial assessment   With this mom of a term baby, now 3 hours old. Mom has breast fed once  For 20 minutes. The baby was asleep skin to skin with mom. Mom agreed to try and latch baby, but he was fussy, and went back to sleep. Mom will call for help with latch, when baby shows cues of hunger.  Basic teaching on breastfeeding and lactation done with mom. I showed mom how to hand express, but was not able to express andy colostrum at this time.  Mom has normal nipples, but they are recessed into her areolas. I showed mom how to use a hand pump to try and evert them, prior to latching. This may help a little. The pumping caused discomfort for mom - so this may not be useful. I told mom we would set up a DEP, if the baby has trouble latching, once he is more awake.    Maternal Data Formula Feeding for Exclusion: No Has patient been taught Hand Expression?: Yes Does the patient have breastfeeding experience prior to this delivery?: No  Feeding Feeding Type: Breast Fed Length of feed:  (few sucks)  LATCH Score/Interventions Latch: Repeated attempts needed to sustain latch, nipple held in mouth throughout feeding, stimulation needed to elicit sucking reflex. Intervention(s): Adjust position;Assist with latch;Breast compression  Audible Swallowing: None  Type of Nipple: Inverted (nipples aare partialy recesed into areolas)  Comfort (Breast/Nipple): Soft / non-tender     Hold (Positioning): Assistance needed to correctly position infant at breast and maintain latch.  LATCH Score: 4  Lactation Tools Discussed/Used Tools: Pump Breast pump type: Other (comment) (to help with everting her nipples prior to latching)   Consult Status Consult Status: Follow-up Date: 11/11/14 Follow-up type: In-patient    Alfred Levins 11/10/2014, 12:42 PM

## 2014-11-10 NOTE — Anesthesia Preprocedure Evaluation (Signed)
Anesthesia Evaluation  Patient identified by MRN, date of birth, ID band Patient awake    Reviewed: Allergy & Precautions, H&P , NPO status , Patient's Chart, lab work & pertinent test results  History of Anesthesia Complications Negative for: history of anesthetic complications  Airway Mallampati: I  TM Distance: >3 FB Neck ROM: full    Dental no notable dental hx.    Pulmonary neg pulmonary ROS,  breath sounds clear to auscultation  Pulmonary exam normal       Cardiovascular negative cardio ROS Normal cardiovascular examRhythm:regular Rate:Normal     Neuro/Psych negative neurological ROS  negative psych ROS   GI/Hepatic negative GI ROS, Neg liver ROS,   Endo/Other  negative endocrine ROS  Renal/GU negative Renal ROS  negative genitourinary   Musculoskeletal   Abdominal   Peds  Hematology negative hematology ROS (+)   Anesthesia Other Findings Pregnancy - uncomplicated Platelets and allergies reviewed Denies active cardiac or pulmonary symptoms, METS > 4  Denies blood thinning medications, bleeding disorders, hypertension, asthma, supine hypotension syndrome, previous anesthesia difficulties   Rheumatic fever as a child   Reproductive/Obstetrics (+) Pregnancy                             Anesthesia Physical Anesthesia Plan  ASA: II  Anesthesia Plan: Epidural   Post-op Pain Management:    Induction:   Airway Management Planned:   Additional Equipment:   Intra-op Plan:   Post-operative Plan:   Informed Consent: I have reviewed the patients History and Physical, chart, labs and discussed the procedure including the risks, benefits and alternatives for the proposed anesthesia with the patient or authorized representative who has indicated his/her understanding and acceptance.     Plan Discussed with: Anesthesiologist  Anesthesia Plan Comments:         Anesthesia  Quick Evaluation

## 2014-11-10 NOTE — Anesthesia Procedure Notes (Addendum)
Epidural Patient location during procedure: OB  Staffing Anesthesiologist: Malajah Oceguera Performed by: anesthesiologist   Preanesthetic Checklist Completed: patient identified, site marked, surgical consent, pre-op evaluation, timeout performed, IV checked, risks and benefits discussed and monitors and equipment checked  Epidural Patient position: sitting Prep: site prepped and draped and DuraPrep Patient monitoring: continuous pulse ox and blood pressure Approach: midline Location: L2-L3 Injection technique: LOR saline  Needle:  Needle type: Tuohy  Needle gauge: 17 G Needle length: 9 cm and 9 Needle insertion depth: 4 cm Catheter type: closed end flexible Catheter size: 19 Gauge Catheter at skin depth: 8 cm Test dose: negative  Assessment Events: blood not aspirated, injection not painful, no injection resistance, negative IV test and no paresthesia  Additional Notes Patient identified. Risks/Benefits/Options discussed with patient including but not limited to bleeding, infection, nerve damage, paralysis, failed block, incomplete pain control, headache, blood pressure changes, nausea, vomiting, reactions to medication both or allergic, itching and postpartum back pain. Confirmed with bedside nurse the patient's most recent platelet count. Confirmed with patient that they are not currently taking any anticoagulation, have any bleeding history or any family history of bleeding disorders. Patient expressed understanding and wished to proceed. All questions were answered. Sterile technique was used throughout the entire procedure. Please see nursing notes for vital signs. Test dose was given through epidural catheter and negative prior to continuing to dose epidural or start infusion. Warning signs of high block given to the patient including shortness of breath, tingling/numbness in hands, complete motor block, or any concerning symptoms with instructions to call for help. Patient was  given instructions on fall risk and not to get out of bed. All questions and concerns addressed with instructions to call with any issues or inadequate analgesia.

## 2014-11-11 LAB — CBC
HCT: 27.6 % — ABNORMAL LOW (ref 36.0–46.0)
Hemoglobin: 8.5 g/dL — ABNORMAL LOW (ref 12.0–15.0)
MCH: 23 pg — AB (ref 26.0–34.0)
MCHC: 30.8 g/dL (ref 30.0–36.0)
MCV: 74.6 fL — AB (ref 78.0–100.0)
Platelets: 233 10*3/uL (ref 150–400)
RBC: 3.7 MIL/uL — ABNORMAL LOW (ref 3.87–5.11)
RDW: 19.3 % — ABNORMAL HIGH (ref 11.5–15.5)
WBC: 13.8 10*3/uL — ABNORMAL HIGH (ref 4.0–10.5)

## 2014-11-11 MED ORDER — FERROUS SULFATE 325 (65 FE) MG PO TABS
325.0000 mg | ORAL_TABLET | Freq: Two times a day (BID) | ORAL | Status: DC
  Administered 2014-11-11 – 2014-11-12 (×2): 325 mg via ORAL
  Filled 2014-11-11 (×2): qty 1

## 2014-11-11 NOTE — Progress Notes (Signed)
Kelsey [name]   Subjective: Post Partum Day 1 Vaginal delivery, sulcal tear, superficial bilateral inner labial Patient up ad lib, denies syncope or dizziness. Reports consuming regular diet without issues and denies N/V No issues with urination and reports bleeding is appropriate  Feeding:  brast Contraceptive plan:   unsure  Objective: Temp:  [97.5 F (36.4 C)-98.7 F (37.1 C)] 97.8 F (36.6 C) (07/26 0615) Pulse Rate:  [86-91] 91 (07/26 0615) Resp:  [16-18] 18 (07/26 0615) BP: (100-114)/(64-73) 100/64 mmHg (07/26 0615)  Physical Exam:  General: alert and cooperative Ext: WNL, no significant  edema. No evidence of DVT seen on physical exam. Breast: Soft filling Lungs: CTAB Heart RRR without murmur  Abdomen:  Soft, fundus firm, lochia scant, + bowel sounds, non distended, non tender Lochia: appropriate Uterine Fundus: firm Laceration: healing well    Recent Labs  11/10/14 0145 11/11/14 0511  HGB 10.3* 8.5*  HCT 32.2* 27.6*    Assessment S/P Vaginal Delivery-Day 1 Stable  Normal Involution Breastfeeding Circumcision:  Plan: Continue current care Iron supplement  Plan for discharge tomorrow and Breastfeeding Lactation support   Sarath Privott, CNM, MSN 11/11/2014, 12:33 PM

## 2014-11-11 NOTE — Clinical Documentation Improvement (Signed)
Please specify the diagnosis related to below supporting information, if appropriate.   Possible Clinical Conditions? _______[name]  _______[name]  _______[name]  _______Other Condition__________________ _______Cannot Clinically Determine    Supporting Information: Per 11/11/14 MD progress note = sulcal tear, superficial bilateral inner labial.   [name]: healing well.     Per 11/10/14  L&D progress note = The vulva, perineum, vaginal vault, rectum and cervix were inspected and revealed a sulcal tear which was repair using a 3-0 vicryl on a CT needle, a superficial bilateral inner labial [name] which was repaired using a 4-0 vicryl on a SH  Lidocaine was not used, the epidural was sufficient for the repair.    Thank You, Shelda Pal ,RN Clinical Documentation Specialist:  [phone]  [facility]- Health Information Management

## 2014-11-12 MED ORDER — IBUPROFEN 600 MG PO TABS: 600.0000 mg | ORAL_TABLET | Freq: Four times a day (QID) | ORAL | Status: DC | PRN

## 2014-11-12 MED ORDER — FERROUS SULFATE 325 (65 FE) MG PO TABS: 325.0000 mg | ORAL_TABLET | Freq: Two times a day (BID) | ORAL | Status: DC

## 2014-11-12 NOTE — Discharge Instructions (Signed)
Postpartum Depression and Baby Blues °The postpartum period begins right after the birth of a baby. During this time, there is often a great amount of joy and excitement. It is also a time of many changes in the life of the parents. Regardless of how many times a mother gives birth, each child brings new challenges and dynamics to the family. It is not unusual to have feelings of excitement along with confusing shifts in moods, emotions, and thoughts. All mothers are at risk of developing postpartum depression or the "baby blues." These mood changes can occur right after giving birth, or they may occur many months after giving birth. The baby blues or postpartum depression can be mild or severe. Additionally, postpartum depression can go away rather quickly, or it can be a long-term condition.  °CAUSES °Raised hormone levels and the rapid drop in those levels are thought to be a main cause of postpartum depression and the baby blues. A number of hormones change during and after pregnancy. Estrogen and progesterone usually decrease right after the delivery of your baby. The levels of thyroid hormone and various cortisol steroids also rapidly drop. Other factors that play a role in these mood changes include major life events and genetics.  °RISK FACTORS °If you have any of the following risks for the baby blues or postpartum depression, know what symptoms to watch out for during the postpartum period. Risk factors that may increase the likelihood of getting the baby blues or postpartum depression include: °· Having a personal or family history of depression.   °· Having depression while being pregnant.   °· Having premenstrual mood issues or mood issues related to oral contraceptives. °· Having a lot of life stress.   °· Having marital conflict.   °· Lacking a social support network.   °· Having a baby with special needs.   °· Having health problems, such as diabetes.   °SIGNS AND SYMPTOMS °Symptoms of baby blues  include: °· Brief changes in mood, such as going from extreme happiness to sadness. °· Decreased concentration.   °· Difficulty sleeping.   °· Crying spells, tearfulness.   °· Irritability.   °· Anxiety.   °Symptoms of postpartum depression typically begin within the first month after giving birth. These symptoms include: °· Difficulty sleeping or excessive sleepiness.   °· Marked weight loss.   °· Agitation.   °· Feelings of worthlessness.   °· Lack of interest in activity or food.   °Postpartum psychosis is a very serious condition and can be dangerous. Fortunately, it is rare. Displaying any of the following symptoms is cause for immediate medical attention. Symptoms of postpartum psychosis include:  °· Hallucinations and delusions.   °· Bizarre or disorganized behavior.   °· Confusion or disorientation.   °DIAGNOSIS  °A diagnosis is made by an evaluation of your symptoms. There are no medical or lab tests that lead to a diagnosis, but there are various questionnaires that a health care provider may use to identify those with the baby blues, postpartum depression, or psychosis. Often, a screening tool called the Edinburgh Postnatal Depression Scale is used to diagnose depression in the postpartum period.  °TREATMENT °The baby blues usually goes away on its own in 1-2 weeks. Social support is often all that is needed. You will be encouraged to get adequate sleep and rest. Occasionally, you may be given medicines to help you sleep.  °Postpartum depression requires treatment because it can last several months or longer if it is not treated. Treatment may include individual or group therapy, medicine, or both to address any social, physiological, and psychological   factors that may play a role in the depression. Regular exercise, a healthy diet, rest, and social support may also be strongly recommended.  °Postpartum psychosis is more serious and needs treatment right away. Hospitalization is often needed. °HOME CARE  INSTRUCTIONS °· Get as much rest as you can. Nap when the baby sleeps.   °· Exercise regularly. Some women find yoga and walking to be beneficial.   °· Eat a balanced and nourishing diet.   °· Do little things that you enjoy. Have a cup of tea, take a bubble bath, read your favorite magazine, or listen to your favorite music. °· Avoid alcohol.   °· Ask for help with household chores, cooking, grocery shopping, or running errands as needed. Do not try to do everything.   °· Talk to people close to you about how you are feeling. Get support from your partner, family members, friends, or other new moms. °· Try to stay positive in how you think. Think about the things you are grateful for.   °· Do not spend a lot of time alone.   °· Only take over-the-counter or prescription medicine as directed by your health care provider. °· Keep all your postpartum appointments.   °· Let your health care provider know if you have any concerns.   °SEEK MEDICAL CARE IF: °You are having a reaction to or problems with your medicine. °SEEK IMMEDIATE MEDICAL CARE IF: °· You have suicidal feelings.   °· You think you may harm the baby or someone else. °MAKE SURE YOU: °· Understand these instructions. °· Will watch your condition. °· Will get help right away if you are not doing well or get worse. °Document Released: 01/07/2004 Document Revised: 04/09/2013 Document Reviewed: 01/14/2013 °ExitCare® Patient Information ©2015 [employer]. This information is not intended to replace advice given to you by your health care provider. Make sure you discuss any questions you have with your health care provider. °Contraception Choices °Birth control (contraception) is the use of any methods or devices to stop pregnancy from happening. Below are some methods to help avoid pregnancy. °HORMONAL BIRTH CONTROL °· A small tube put under the skin of the upper arm (implant). The tube can stay in place for 3 years. The implant must be taken out after 3  years. °· Shots given every 3 months. °· Pills taken every day. °· Patches that are changed once a week. °· A ring put into the vagina (vaginal ring). The ring is left in place for 3 weeks and removed for 1 week. Then, a new ring is put in the vagina. °· Emergency birth control pills taken after unprotected sex (intercourse). °BARRIER BIRTH CONTROL  °· A thin covering worn on the penis (female condom) during sex. °· A soft, loose covering put into the vagina (female condom) before sex. °· A rubber bowl that sits over the cervix (diaphragm). The bowl must be made for you. The bowl is put into the vagina before sex. The bowl is left in place for 6 to 8 hours after sex. °· A small, soft cup that fits over the cervix (cervical cap). The cup must be made for you. The cup can be left in place for 48 hours after sex. °· A sponge that is put into the vagina before sex. °· A chemical that kills or stops sperm from getting into the cervix and uterus (spermicide). The chemical may be a cream, jelly, foam, or pill. °INTRAUTERINE (IUD) BIRTH CONTROL  °· IUD birth control is a small, T-shaped piece of plastic. The plastic is   put inside the uterus. There are 2 types of IUD: °¨ Copper IUD. The IUD is covered in copper wire. The copper makes a fluid that kills sperm. It can stay in place for 10 years. °¨ Hormone IUD. The hormone stops pregnancy from happening. It can stay in place for 5 years. °PERMANENT METHODS °· When the woman has her fallopian tubes sealed, tied, or blocked during surgery. This stops the egg from traveling to the uterus. °· The doctor places a small coil or insert into each fallopian tube. This causes scar tissue to form and blocks the fallopian tubes. °· When the female has the tubes that carry sperm tied off (vasectomy). °NATURAL FAMILY PLANNING BIRTH CONTROL  °· Natural family planning means not having sex or using barrier birth control on the days the woman could become pregnant. °· Use a calendar to keep track  of the length of each period and know the days she can get pregnant. °· Avoid sex during ovulation. °· Use a thermometer to measure body temperature. Also watch for symptoms of ovulation. °· Time sex to be after the woman has ovulated. °Use condoms to help protect yourself against sexually transmitted infections (STIs). Do this no matter what type of birth control you use. Talk to your doctor about which type of birth control is best for you. °Document Released: 01/30/2009 Document Revised: 04/09/2013 Document Reviewed: 10/24/2012 °ExitCare® Patient Information ©2015 [employer]. This information is not intended to replace advice given to you by your health care provider. Make sure you discuss any questions you have with your health care provider. ° °

## 2014-11-12 NOTE — Lactation Note (Signed)
This note was copied from the chart of Kelsey Tiersa Harral. Lactation Consultation Note  Follow up visit done prior to discharge.  Parents state baby was feeding constantly and not content so they gave a small amount of formula this AM.  They state baby was contented after formula.  Reviewed basic teaching and discharge instructions including engorgement treatment.  Recommended parents discontinue formula once milk comes to volume.  Outpatient lactation services and support reviewed and encouraged.  Patient Name: Kelsey [name] [id number] Date: 11/12/2014     Maternal Data    Feeding Feeding Type: Formula Length of feed: 3 min  LATCH Score/Interventions                      Lactation Tools Discussed/Used     Consult Status      Huston Foley 11/12/2014, 9:44 AM

## 2014-11-12 NOTE — Discharge Summary (Signed)
  Vaginal Delivery Discharge Summary  Kelsey [name]  DOB:    [birth year] MRN:    [id number] CSN:    [id number]  Date of admission:                  November 09, 2014  Date of discharge:                   November 12, 2014  Procedures this admission:   SVD with Sulcus and Superficial Bilateral Labial Lacerations  Date of Delivery: November 09, 2014  Newborn Data:  Live born female  Birth Weight: 7 lb 9.8 oz (3453 g) APGAR: 9, 9  Home with mother. Name: Unknown Circumcision Plan: Unknown  History of Present Illness:  Kelsey [name] is a 24 y.o. female, G2P1011, who presents at [redacted]w[redacted]d weeks gestation. The patient has been followed at [facility]. She was admitted onset of labor. Her pregnancy has been complicated by:  Patient Active Problem List   Diagnosis Date Noted  . Normal vaginal delivery 11/10/2014  . Hx of rheumatic fever 09/10/2014     Hospital Course:  Admitted for active labor. Negative GBS. Progressed without augmentation. Utilized epidural for pain management.  Delivery was performed by V.Standard, CNM without complication. Patient and baby tolerated the procedure without difficulty, with  Sulcus and BLL laceration noted. Infant status was stable and remained in room with mother.  Mother and infant then had an uncomplicated postpartum course, with breast feeding going well. Mom's physical exam was WNL, and she was discharged home in stable condition. Contraception plan was undecided.  She received adequate benefit from po pain medications.   Feeding:  breast  Contraception:  Undecided  Hemoglobin Results:  CBC Latest Ref Rng 11/11/2014 11/10/2014 05/03/2014  WBC 4.0 - 10.5 K/uL 13.8(H) 13.5(H) 10.9(H)  Hemoglobin 12.0 - 15.0 g/dL 0.8(M) 10.3(L) 11.2(L)  Hematocrit 36.0 - 46.0 % 27.6(L) 32.2(L) 34.1(L)  Platelets 150 - 400 K/uL 233 280 271     Discharge Physical Exam:   General: alert, cooperative  and no distress Mood/Affect: Bright Chest: clear to auscultation, no wheezes, rales or rhonchi, symmetric air entry.  CVS exam: normal rate and regular rhythm. Breast exam: not examined. Abdomen:  + bowel sounds, Soft, NT Uterine Fundus: firm at U/-1 Lochia: appropriate Laceration: Not examined DVT Evaluation: No evidence of DVT seen on physical exam. No significant calf/ankle edema. Skin exam: Warm Dru.  Procedures &/or Complications: Intrapartum Procedures: spontaneous vaginal delivery and laceration repair Postpartum Procedures: none Complications-Operative and Postpartum: Sulcus and Bilateral Labial laceration   Discharge Information:  Diagnoses: Term Pregnancy-delivered Activity:  pelvic rest Diet:   routine Medications: Ibuprofen and Iron Condition: stable Instructions:  Pain Management, Peri-Care, Breastfeeding, Who and When to call for postpartum complications. Information Sheet(s) given PPD and Contraception Choices Discharge to: home  Follow-up Information    Follow up with [facility]. Schedule an appointment as soon as possible for a visit in 6 weeks.   Specialty:  Obstetrics and Gynecology   Why:  Please call if you have any questions or concerns, prior to your next visit.   Contact information:   [address] [place] [address] [phone]     Marlene Bast MSN, CNM 11/12/2014 9:07 AM

## 2015-08-02 IMAGING — US US OB COMP LESS 14 WK
1 series · 14 of 28 positions shown · non-contrast
Comparison: No priors.

CLINICAL DATA: Early pregnancy with vaginal bleeding.

EXAM:
OBSTETRIC <14 WK ULTRASOUND
TECHNIQUE: Transabdominal ultrasound was performed for evaluation of the
gestation as well as the maternal uterus and adnexal regions.

[Series 1: us ob comp less 14 wks · 41 acquisitions, 14 frames shown]
[im 2/41]
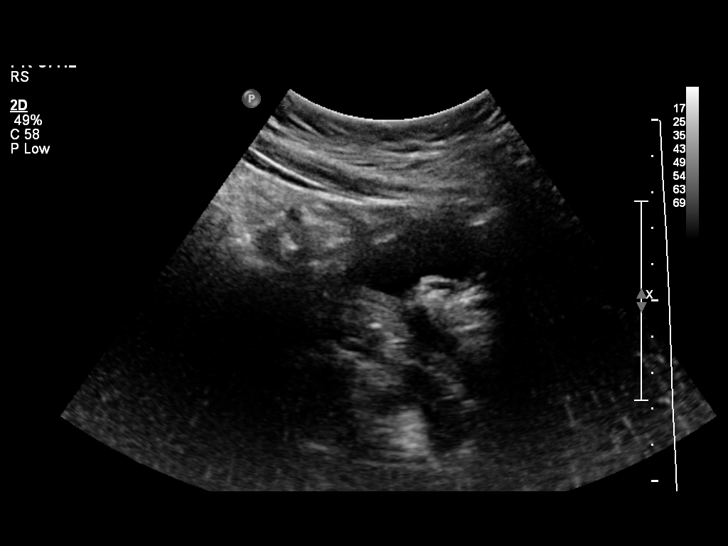
[im 5/41]
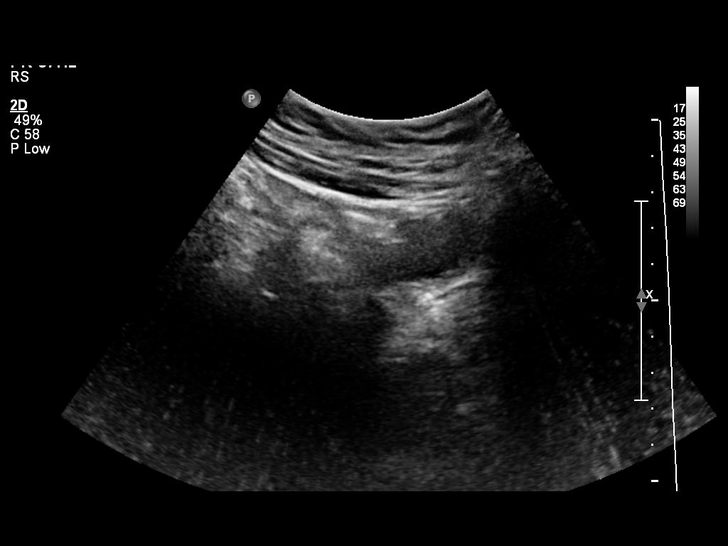
[im 8/41]
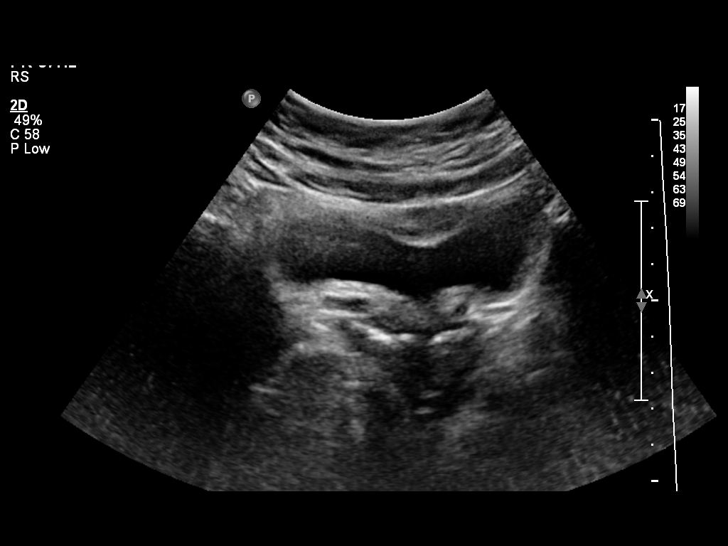
[im 11/41]
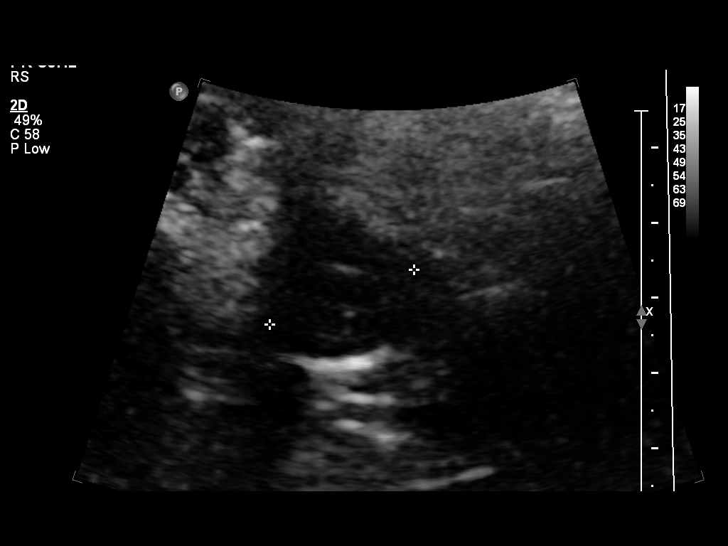
[im 14/41]
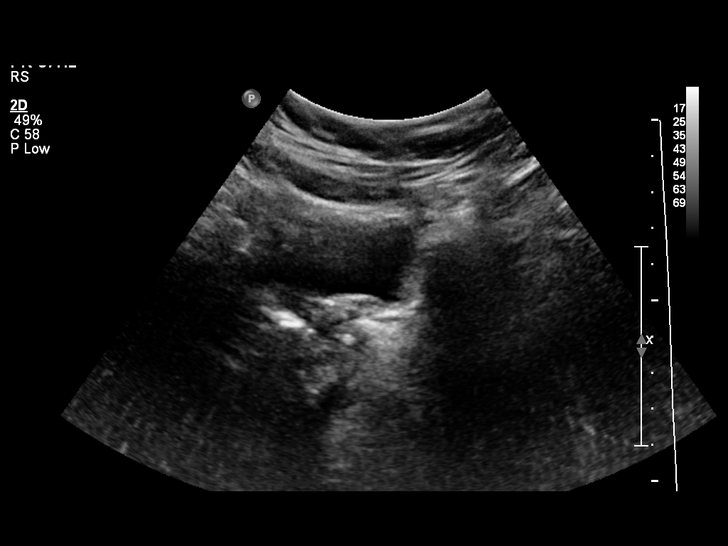
[im 17/41]
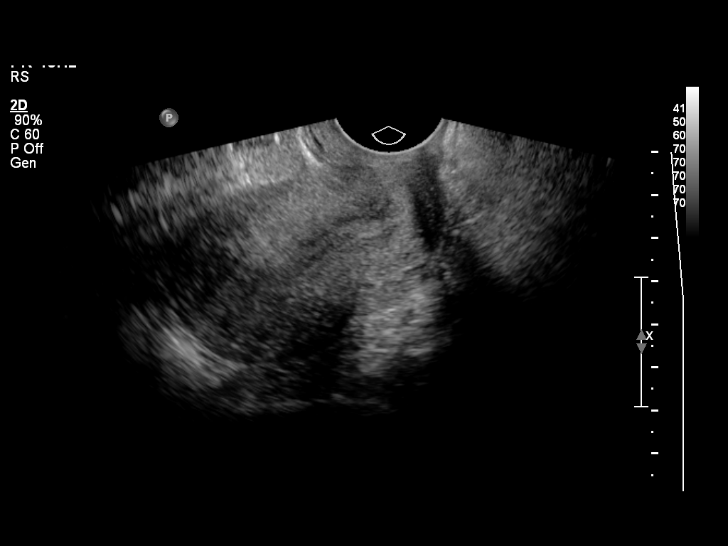
[im 20/41]
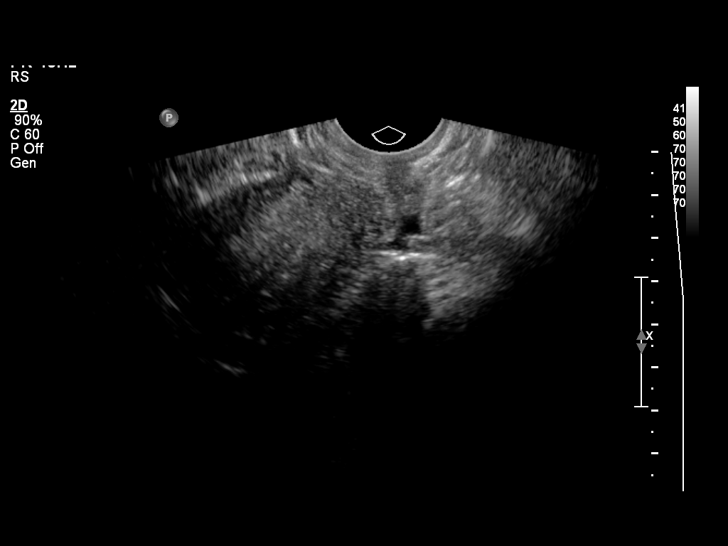
[im 23/41]
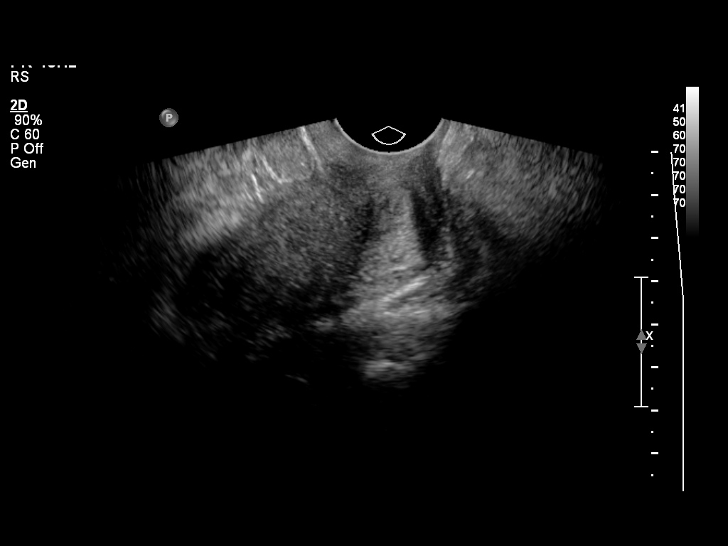
[im 26/41]
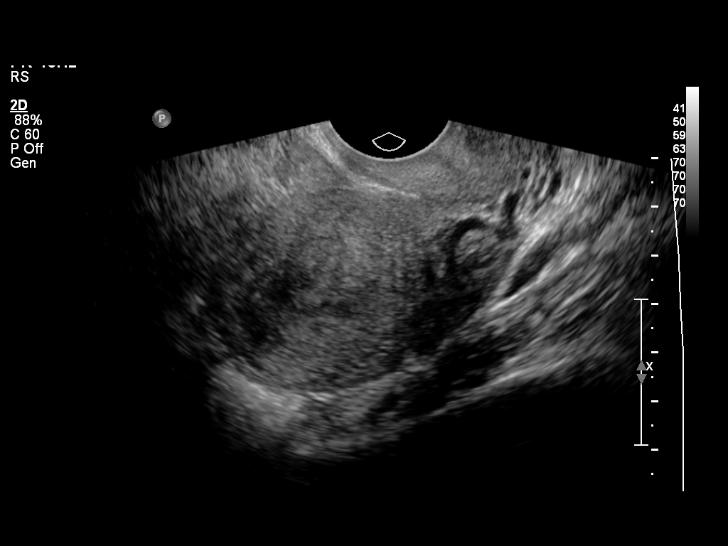
[im 29/41]
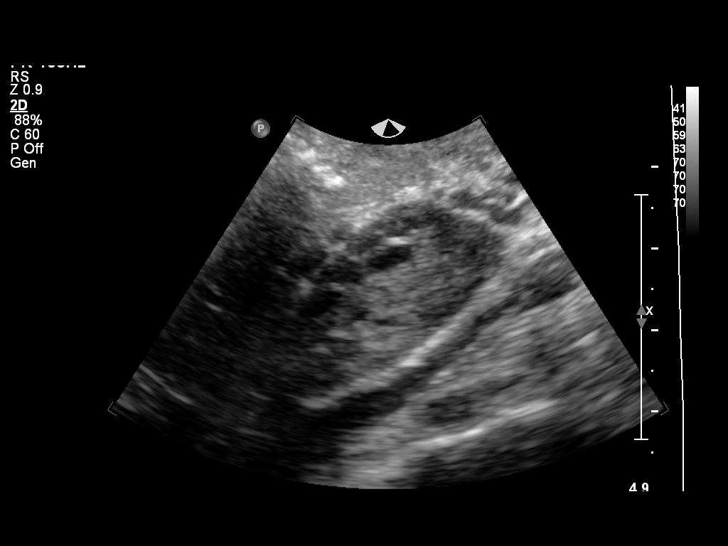
[im 32/41]
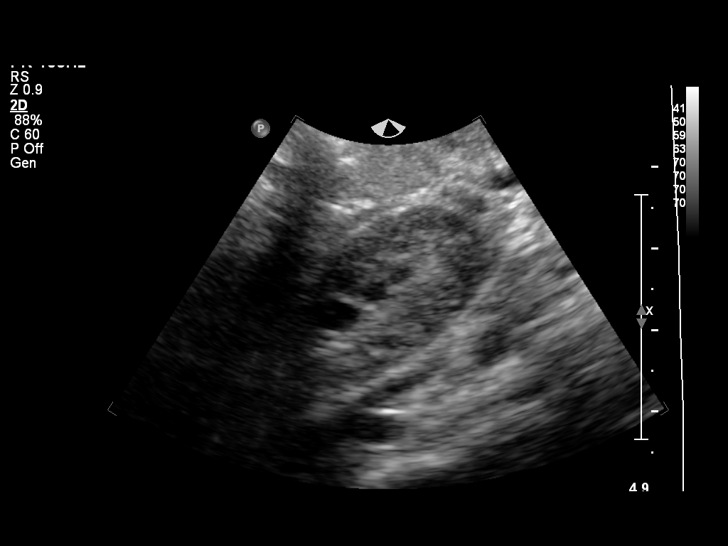
[im 35/41]
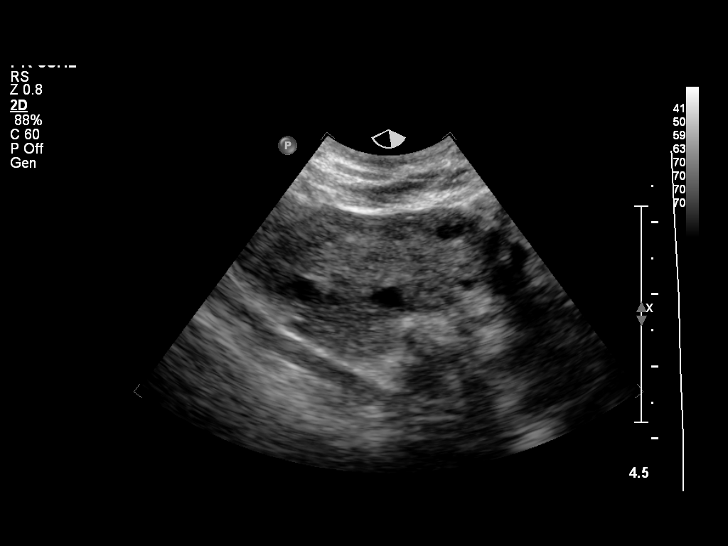
[im 38/41]
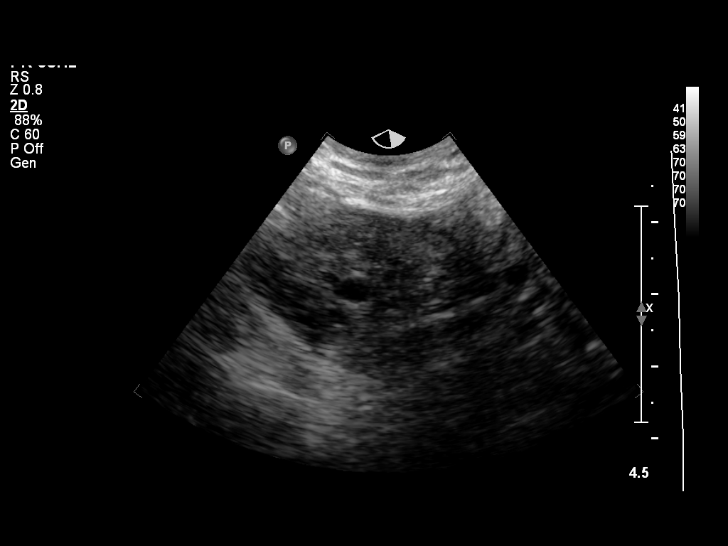
[im 41/41]
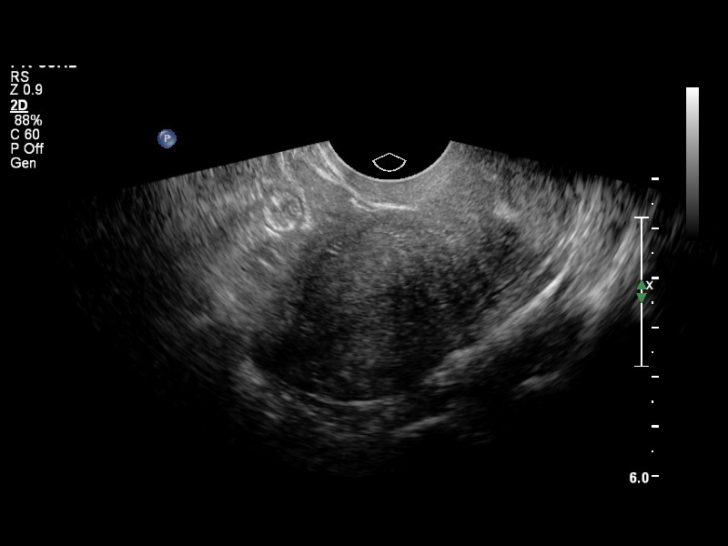

[14 of 28 positions shown; findings below may reference images not displayed]

FINDINGS: Intrauterine gestational sac: None.

Yolk sac:  None.

Embryo:  None.

Cardiac Activity: None.

Heart Rate: N/A

Maternal uterus/adnexae: Bilateral ovaries are normal in echotexture
and appearance with multiple small follicles. No significant free
fluid within the cul-de-sac.
IMPRESSION: 1. No IUP identified.
2. No acute findings.

## 2015-08-24 ENCOUNTER — Other Ambulatory Visit: Payer: Self-pay | Admitting: Gastroenterology

## 2015-08-24 DIAGNOSIS — R1012 Left upper quadrant pain: Secondary | ICD-10-CM

## 2015-08-31 ENCOUNTER — Ambulatory Visit
Admission: RE | Admit: 2015-08-31 | Discharge: 2015-08-31 | Disposition: A | Discharge status: Home / Self Care | Payer: BLUE CROSS/BLUE SHIELD | Source: Ambulatory Visit | Attending: Gastroenterology | Admitting: Gastroenterology

## 2015-08-31 DIAGNOSIS — R1012 Left upper quadrant pain: Secondary | ICD-10-CM

## 2016-01-13 IMAGING — CR DG CHEST 2V
2 series · 2 of 2 positions shown · non-contrast
Comparison: None.

CLINICAL DATA: Diffuse chest pain radiating to the upper back with
shortness of breath since last night. Thirteen weeks pregnant.

EXAM:
CHEST  2 VIEW

[w chest pa]
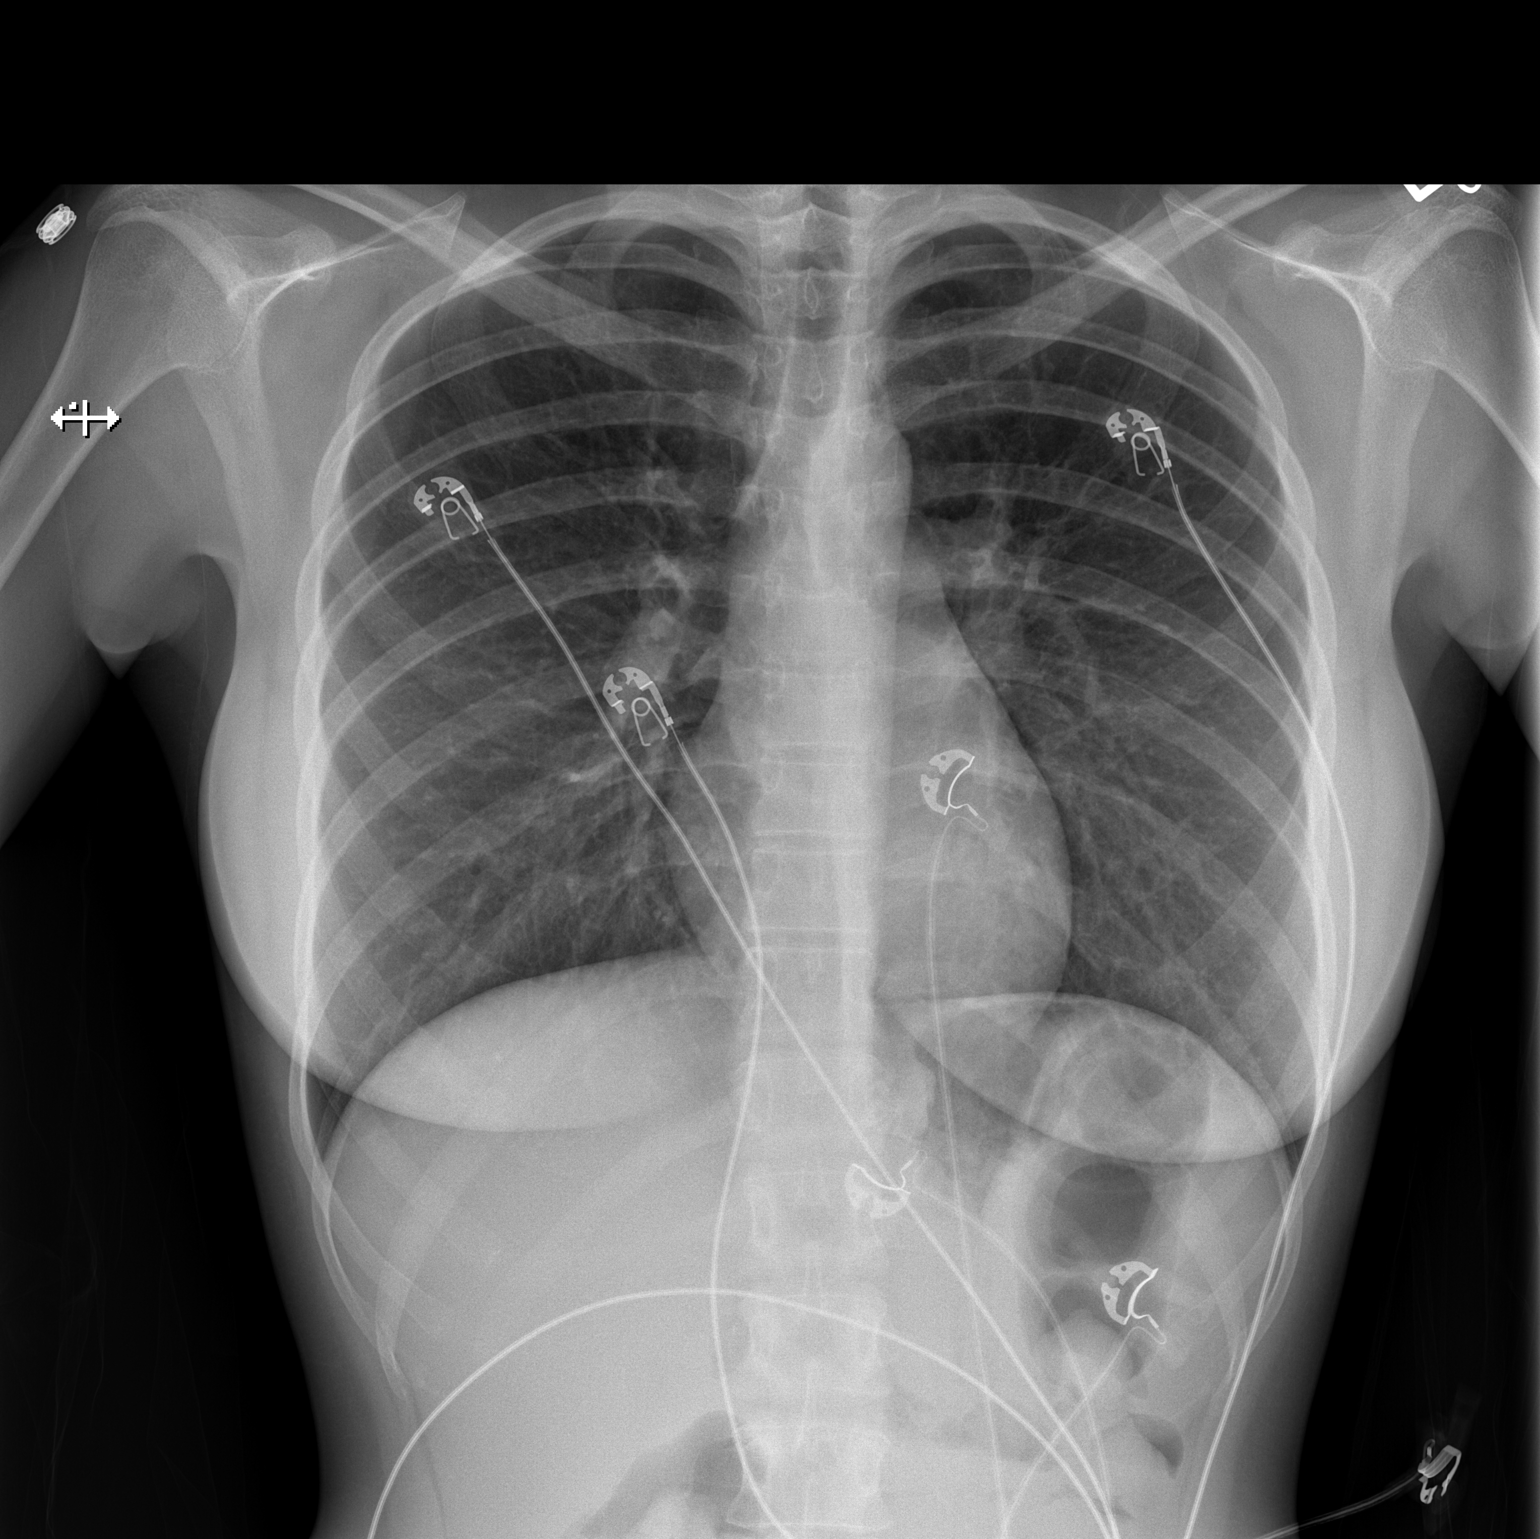

[w chest lat]
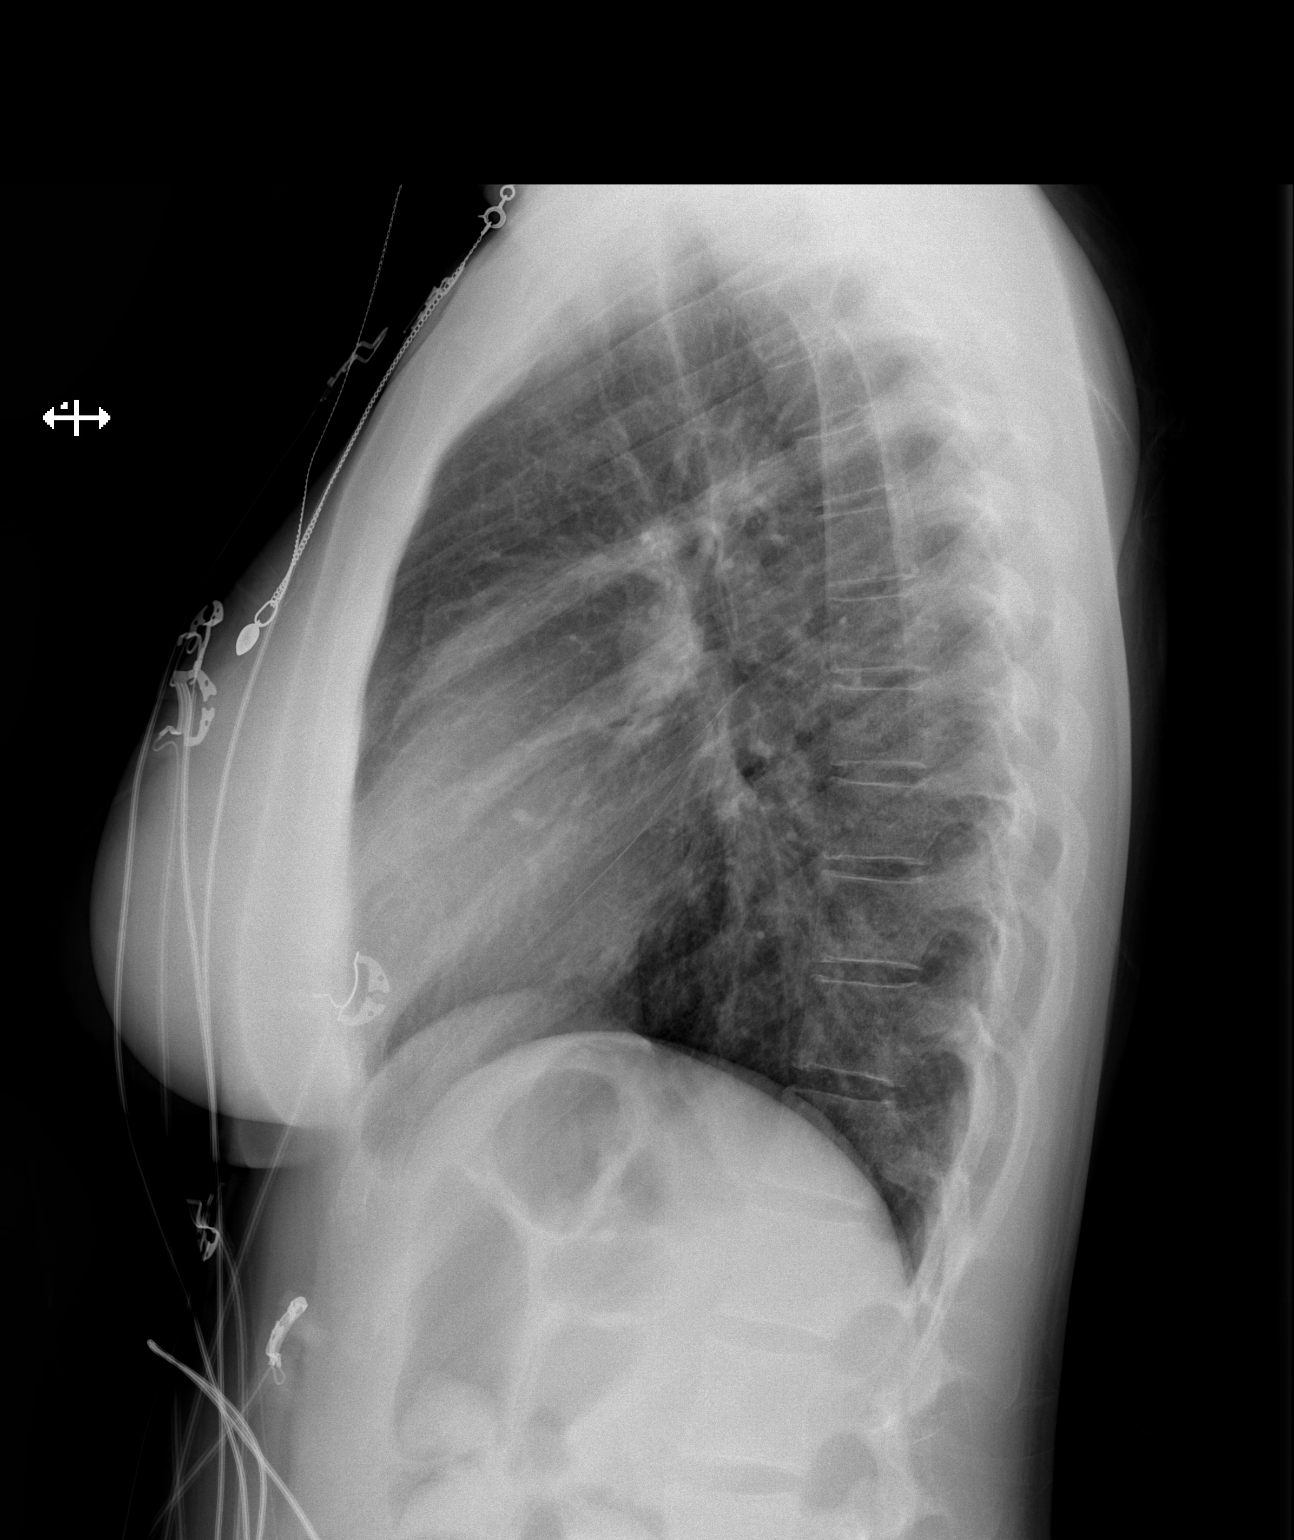

[2 of 2 positions shown; findings below may reference images not displayed]

FINDINGS: Normal sized heart. Clear lungs with normal vascularity. Normal
appearing bones.
IMPRESSION: Normal examination.

## 2016-03-21 LAB — OB RESULTS CONSOLE ABO/RH: RH Type: POSITIVE

## 2016-03-21 LAB — OB RESULTS CONSOLE ANTIBODY SCREEN: Antibody Screen: NEGATIVE

## 2016-03-21 LAB — OB RESULTS CONSOLE RPR: RPR: NONREACTIVE

## 2016-03-21 LAB — OB RESULTS CONSOLE HEPATITIS B SURFACE ANTIGEN: HEP B S AG: NEGATIVE

## 2016-03-21 LAB — OB RESULTS CONSOLE RUBELLA ANTIBODY, IGM: RUBELLA: IMMUNE

## 2016-03-21 LAB — OB RESULTS CONSOLE GC/CHLAMYDIA
CHLAMYDIA, DNA PROBE: NEGATIVE
Gonorrhea: NEGATIVE

## 2016-03-21 LAB — OB RESULTS CONSOLE HIV ANTIBODY (ROUTINE TESTING): HIV: NONREACTIVE

## 2016-04-18 NOTE — L&D Delivery Note (Addendum)
Delivery Note At 9:46 AM a viable female was delivered via  (Presentation: ;  ).  APGAR: 8, 9 ; weight pending.   Placenta status: spont, intact.  Cord: 3V with the following complications: None.  Cord pH: n/a  Anesthesia:  Epidural Episiotomy:  None Lacerations:  None Suture Repair: n/a Est. Blood Loss (mL):  300cc  Mom to postpartum.  Baby to Couplet care / Skin to Skin.  Kelsey [name] Y 11/14/2016, 10:01 AM  Pt would like paragard for birth control.

## 2016-10-24 LAB — OB RESULTS CONSOLE GBS: GBS: NEGATIVE

## 2016-11-14 ENCOUNTER — Encounter (HOSPITAL_COMMUNITY): Payer: Self-pay

## 2016-11-14 ENCOUNTER — Inpatient Hospital Stay (HOSPITAL_COMMUNITY): Payer: BLUE CROSS/BLUE SHIELD | Admitting: Anesthesiology

## 2016-11-14 ENCOUNTER — Inpatient Hospital Stay (HOSPITAL_COMMUNITY)
Admission: AD | Admit: 2016-11-14 | Discharge: 2016-11-15 | DRG: 775 | Disposition: A | Discharge status: Home / Self Care | Payer: BLUE CROSS/BLUE SHIELD | Source: Ambulatory Visit | Attending: Obstetrics and Gynecology | Admitting: Obstetrics and Gynecology

## 2016-11-14 DIAGNOSIS — Z3A39 39 weeks gestation of pregnancy: Secondary | ICD-10-CM

## 2016-11-14 DIAGNOSIS — Z3493 Encounter for supervision of normal pregnancy, unspecified, third trimester: Secondary | ICD-10-CM | POA: Diagnosis present

## 2016-11-14 LAB — CBC
HCT: 32.6 % — ABNORMAL LOW (ref 36.0–46.0)
Hemoglobin: 10 g/dL — ABNORMAL LOW (ref 12.0–15.0)
MCH: 20.7 pg — AB (ref 26.0–34.0)
MCHC: 30.7 g/dL (ref 30.0–36.0)
MCV: 67.5 fL — AB (ref 78.0–100.0)
PLATELETS: 311 10*3/uL (ref 150–400)
RBC: 4.83 MIL/uL (ref 3.87–5.11)
RDW: 18.2 % — AB (ref 11.5–15.5)
WBC: 10.9 10*3/uL — AB (ref 4.0–10.5)

## 2016-11-14 LAB — RPR: RPR Ser Ql: NONREACTIVE

## 2016-11-14 LAB — TYPE AND SCREEN
ABO/RH(D): AB POS
ANTIBODY SCREEN: NEGATIVE

## 2016-11-14 MED ORDER — PHENYLEPHRINE 40 MCG/ML (10ML) SYRINGE FOR IV PUSH (FOR BLOOD PRESSURE SUPPORT)
80.0000 ug | PREFILLED_SYRINGE | INTRAVENOUS | Status: DC | PRN
  Filled 2016-11-14: qty 5

## 2016-11-14 MED ORDER — FLEET ENEMA 7-19 GM/118ML RE ENEM: 1.0000 | ENEMA | RECTAL | Status: DC | PRN

## 2016-11-14 MED ORDER — EPHEDRINE 5 MG/ML INJ
10.0000 mg | INTRAVENOUS | Status: DC | PRN
  Filled 2016-11-14: qty 2

## 2016-11-14 MED ORDER — DIPHENHYDRAMINE HCL 50 MG/ML IJ SOLN: 12.5000 mg | INTRAMUSCULAR | Status: DC | PRN

## 2016-11-14 MED ORDER — OXYCODONE-ACETAMINOPHEN 5-325 MG PO TABS: 1.0000 | ORAL_TABLET | ORAL | Status: DC | PRN

## 2016-11-14 MED ORDER — BENZOCAINE-MENTHOL 20-0.5 % EX AERO: 1.0000 "application " | INHALATION_SPRAY | CUTANEOUS | Status: DC | PRN

## 2016-11-14 MED ORDER — LACTATED RINGERS IV SOLN
500.0000 mL | Freq: Once | INTRAVENOUS | Status: AC
  Administered 2016-11-14: 500 mL via INTRAVENOUS

## 2016-11-14 MED ORDER — FENTANYL 2.5 MCG/ML BUPIVACAINE 1/10 % EPIDURAL INFUSION (WH - ANES)
14.0000 mL/h | INTRAMUSCULAR | Status: DC | PRN
  Administered 2016-11-14: 14 mL/h via EPIDURAL
  Filled 2016-11-14: qty 100

## 2016-11-14 MED ORDER — SOD CITRATE-CITRIC ACID 500-334 MG/5ML PO SOLN: 30.0000 mL | ORAL | Status: DC | PRN

## 2016-11-14 MED ORDER — ONDANSETRON HCL 4 MG PO TABS: 4.0000 mg | ORAL_TABLET | ORAL | Status: DC | PRN

## 2016-11-14 MED ORDER — IBUPROFEN 600 MG PO TABS
600.0000 mg | ORAL_TABLET | Freq: Four times a day (QID) | ORAL | Status: DC
  Administered 2016-11-14 – 2016-11-15 (×5): 600 mg via ORAL
  Filled 2016-11-14 (×5): qty 1

## 2016-11-14 MED ORDER — LIDOCAINE HCL (PF) 1 % IJ SOLN
INTRAMUSCULAR | Status: DC | PRN
  Administered 2016-11-14: 6 mL via EPIDURAL
  Administered 2016-11-14: 7 mL via EPIDURAL

## 2016-11-14 MED ORDER — SIMETHICONE 80 MG PO CHEW: 80.0000 mg | CHEWABLE_TABLET | ORAL | Status: DC | PRN

## 2016-11-14 MED ORDER — OXYCODONE HCL 5 MG PO TABS: 10.0000 mg | ORAL_TABLET | ORAL | Status: DC | PRN

## 2016-11-14 MED ORDER — ACETAMINOPHEN 325 MG PO TABS
650.0000 mg | ORAL_TABLET | ORAL | Status: DC | PRN
  Administered 2016-11-14: 650 mg via ORAL
  Filled 2016-11-14: qty 2

## 2016-11-14 MED ORDER — OXYTOCIN BOLUS FROM INFUSION
500.0000 mL | Freq: Once | INTRAVENOUS | Status: AC
  Administered 2016-11-14: 500 mL via INTRAVENOUS

## 2016-11-14 MED ORDER — ZOLPIDEM TARTRATE 5 MG PO TABS: 5.0000 mg | ORAL_TABLET | Freq: Every evening | ORAL | Status: DC | PRN

## 2016-11-14 MED ORDER — PRENATAL MULTIVITAMIN CH
1.0000 | ORAL_TABLET | Freq: Every day | ORAL | Status: DC
  Administered 2016-11-14 – 2016-11-15 (×2): 1 via ORAL
  Filled 2016-11-14 (×2): qty 1

## 2016-11-14 MED ORDER — DIPHENHYDRAMINE HCL 25 MG PO CAPS: 25.0000 mg | ORAL_CAPSULE | Freq: Four times a day (QID) | ORAL | Status: DC | PRN

## 2016-11-14 MED ORDER — TETANUS-DIPHTH-ACELL PERTUSSIS 5-2.5-18.5 LF-MCG/0.5 IM SUSP: 0.5000 mL | Freq: Once | INTRAMUSCULAR | Status: DC

## 2016-11-14 MED ORDER — LACTATED RINGERS IV SOLN
INTRAVENOUS | Status: DC
  Administered 2016-11-14: 08:00:00 via INTRAVENOUS

## 2016-11-14 MED ORDER — OXYCODONE HCL 5 MG PO TABS: 5.0000 mg | ORAL_TABLET | ORAL | Status: DC | PRN

## 2016-11-14 MED ORDER — DIBUCAINE 1 % RE OINT: 1.0000 "application " | TOPICAL_OINTMENT | RECTAL | Status: DC | PRN

## 2016-11-14 MED ORDER — ONDANSETRON HCL 4 MG/2ML IJ SOLN: 4.0000 mg | Freq: Four times a day (QID) | INTRAMUSCULAR | Status: DC | PRN

## 2016-11-14 MED ORDER — OXYCODONE-ACETAMINOPHEN 5-325 MG PO TABS: 2.0000 | ORAL_TABLET | ORAL | Status: DC | PRN

## 2016-11-14 MED ORDER — WITCH HAZEL-GLYCERIN EX PADS: 1.0000 "application " | MEDICATED_PAD | CUTANEOUS | Status: DC | PRN

## 2016-11-14 MED ORDER — COCONUT OIL OIL
1.0000 "application " | TOPICAL_OIL | Status: DC | PRN
  Administered 2016-11-15: 1 via TOPICAL
  Filled 2016-11-14: qty 120

## 2016-11-14 MED ORDER — SENNOSIDES-DOCUSATE SODIUM 8.6-50 MG PO TABS
2.0000 | ORAL_TABLET | ORAL | Status: DC
  Administered 2016-11-14: 2 via ORAL
  Filled 2016-11-14: qty 2

## 2016-11-14 MED ORDER — LACTATED RINGERS IV SOLN: 500.0000 mL | INTRAVENOUS | Status: DC | PRN

## 2016-11-14 MED ORDER — OXYTOCIN 40 UNITS IN LACTATED RINGERS INFUSION - SIMPLE MED
2.5000 [IU]/h | INTRAVENOUS | Status: DC
  Filled 2016-11-14: qty 1000

## 2016-11-14 MED ORDER — ONDANSETRON HCL 4 MG/2ML IJ SOLN: 4.0000 mg | INTRAMUSCULAR | Status: DC | PRN

## 2016-11-14 MED ORDER — PHENYLEPHRINE 40 MCG/ML (10ML) SYRINGE FOR IV PUSH (FOR BLOOD PRESSURE SUPPORT)
80.0000 ug | PREFILLED_SYRINGE | INTRAVENOUS | Status: DC | PRN
  Filled 2016-11-14: qty 5
  Filled 2016-11-14: qty 10

## 2016-11-14 MED ORDER — ACETAMINOPHEN 325 MG PO TABS: 650.0000 mg | ORAL_TABLET | ORAL | Status: DC | PRN

## 2016-11-14 MED ORDER — LIDOCAINE HCL (PF) 1 % IJ SOLN
30.0000 mL | INTRAMUSCULAR | Status: DC | PRN
  Filled 2016-11-14: qty 30

## 2016-11-14 NOTE — Anesthesia Pain Management Evaluation Note (Signed)
  CRNA Pain Management Visit Note  Patient: Kelsey [name], 26 y.o., female  "Hello I am a member of the anesthesia team [facility]. We have an anesthesia team available at all times to provide care throughout the hospital, including epidural management and anesthesia for C-section. I don't know your plan for the delivery whether it a natural birth, water birth, IV sedation, nitrous supplementation, doula or epidural, but we want to meet your pain goals."   1.Was your pain managed to your expectations on prior hospitalizations?   Yes   2.What is your expectation for pain management during this hospitalization?     Epidural  3.How can we help you reach that goal? Epidural when labs are available  Record the patient's initial score and the patient's pain goal.   Pain: 10  Pain Goal: 6 [facility] wants you to be able to say your pain was always managed very well.  Edison PaceWILKERSON,Quianna Avery 11/14/2016

## 2016-11-14 NOTE — Lactation Note (Signed)
This note was copied from a baby's chart. Lactation Consultation Note  Patient Name: Kelsey Shan LevansChaimae Rheaume [id number]'[id number]'s Date: 11/14/2016 Reason for consult: Initial assessment   Initial assessment with Exp BF mom of 6 hour old infant. Infant asleep in crib, mom reports he has BF x 1.   Mom reports she plans to breast and formula feed. Mom exclusively BF her son for 1 year. She reports her older son wanted to eat all the time. She reports he grew well in the beginning and did not accept solid foods well and started loosing weight. She wants to give formula to help her not have to feed as often. Reviewed supply and demand, LEAD, digestibility of EBM vs Formula and cluster feeding. Enc mom to exclusively BF for at least 2 weeks. Mom asked when it is best to give formula and was told that was up to her and FOB, infant has no medical reason for formula at this time. Dad told mom he wants her to exclusively BF at least for now. Parents voiced understanding of teaching.   Enc mom to feed infant STS 8-12 x in 24 hours at first feeding cues using both breasts with each feeding. Enc mom to call out for assistance as needed. Mom reports she has been taught to hand express, enc mom to hand express before and after feeding to stimulate milk production. Mom voiced understanding. Discussed mom pumping and hand expressing to obtain colostrum for infant as needed.   Mom has private insurance, enc her to call and see if they provide breast pumps for their clients. BF Resources Handout and LC Brochure given, mom informed of IP/OP services, BF Support Groups and LC phone #.   Mom and dad without further questions/concerns at this time.    Maternal Data Formula Feeding for Exclusion: No Has patient been taught Hand Expression?: Yes Does the patient have breastfeeding experience prior to this delivery?: Yes  Feeding    LATCH Score Latch:  (encouraged mother to call for latch score)                  Interventions Interventions: Breast feeding basics reviewed;Support pillows;Position options  Lactation Tools Discussed/Used WIC Program: No   Consult Status Consult Status: Follow-up Date: 11/15/16 Follow-up type: In-patient    Kelsey [name] 11/14/2016, 4:21 PM

## 2016-11-14 NOTE — Anesthesia Procedure Notes (Signed)
Epidural Patient location during procedure: OB Start time: 11/14/2016 8:32 AM End time: 11/14/2016 8:36 AM  Staffing Anesthesiologist: Leilani AbleHATCHETT, Marquese Burkland Performed: anesthesiologist   Preanesthetic Checklist Completed: patient identified, surgical consent, pre-op evaluation, timeout performed, IV checked, risks and benefits discussed and monitors and equipment checked  Epidural Patient position: sitting Prep: site prepped and draped and DuraPrep Patient monitoring: continuous pulse ox and blood pressure Approach: midline Location: L4-L5 Injection technique: LOR air  Needle:  Needle type: Tuohy  Needle gauge: 17 G Needle length: 9 cm and 9 Needle insertion depth: 5 cm cm Catheter type: closed end flexible Catheter size: 19 Gauge Catheter at skin depth: 10 cm Test dose: negative and Other  Assessment Sensory level: T9 Events: blood not aspirated, injection not painful, no injection resistance, negative IV test and no paresthesia  Additional Notes Reason for block:procedure for pain

## 2016-11-14 NOTE — H&P (Signed)
Kelsey [name] is a 26 [name].o. female presenting for c/o contractions.  No LOF, No VB and Good FM.  Called by RN reporting pt is 7cm.  RN instructed to admit the patient.    OB History    Gravida Para Term Preterm AB Living   3 1 1   1 1    SAB TAB Ectopic Multiple Live Births   1     0 1     Past Medical History:  Diagnosis Date  . Anemia   . Rheumatic fever    History reviewed. No pertinent surgical history. Family History: family history is not on file. Social History:  reports that she has never smoked. She has never used smokeless tobacco. She reports that she does not drink alcohol or use drugs.     Maternal Diabetes: No Genetic Screening: Declined Maternal Ultrasounds/Referrals: Normal Fetal Ultrasounds or other Referrals:  None Maternal Substance Abuse:  No Significant Maternal Medications:  None Significant Maternal Lab Results:  Lab values include: Group B Strep negative Other Comments:  None  ROS History Dilation: 7 Effacement (%): 70, 80 Station: -2 Exam by:: e. poore, rnc Blood pressure 113/75, pulse 95, temperature 98.6 F (37 C), temperature source Axillary, resp. rate 19, height 5\' 6"  (1.676 m), weight 69.9 kg (154 lb), unknown if currently breastfeeding. Exam Physical Exam  Lungs CTA CV RRR Abd gravid, NT Ext no calf tenderness VE per RN 7cm FHT 130s, + accels, no decels, mod variability Toco ctxs q 2-623min  Prenatal labs: ABO, Rh: AB/Positive/-- (12/04 0000) Antibody: Negative (12/04 0000) Rubella: Immune (12/04 0000) RPR: Nonreactive (12/04 0000)  HBsAg: Negative (12/04 0000)  HIV: Non-reactive (12/04 0000)  GBS: Negative (07/09 0000)   Assessment/Plan: P1 at 39 5/7wks in labor.  Admit with routine orders.  Fetal status is overall reassuring with cat 1/reactive tracing.  Pain medicine upon request.    Kelsey [name],Kelsey [name] 11/14/2016, 8:26 AM

## 2016-11-14 NOTE — Plan of Care (Signed)
Problem: Safety: Goal: Ability to remain free from injury will improve Outcome: Completed/Met Date Met: 11/14/16 Instructed patient to call for assistance ambulating to the bathroom until staff assesses when patient can ambulate independently.   Problem: Education: Goal: Knowledge of condition will improve Admission education, safety and unit protocols reviewed with patient and significant other.

## 2016-11-14 NOTE — MAU Note (Signed)
Ctx since 0130. No bleeding, no LOF. Reports good fetal movements.

## 2016-11-14 NOTE — Anesthesia Preprocedure Evaluation (Signed)
Anesthesia Evaluation  Patient identified by MRN, date of birth, ID band Patient awake    Reviewed: Allergy & Precautions, H&P , NPO status , Patient's Chart, lab work & pertinent test results  Airway Mallampati: I  TM Distance: >3 FB Neck ROM: full    Dental no notable dental hx.    Pulmonary neg pulmonary ROS,    Pulmonary exam normal breath sounds clear to auscultation       Cardiovascular negative cardio ROS Normal cardiovascular exam Rhythm:regular Rate:Normal     Neuro/Psych negative neurological ROS  negative psych ROS   GI/Hepatic negative GI ROS, Neg liver ROS,   Endo/Other  negative endocrine ROS  Renal/GU negative Renal ROS     Musculoskeletal   Abdominal Normal abdominal exam  (+)   Peds  Hematology  (+) Blood dyscrasia, anemia ,   Anesthesia Other Findings   Reproductive/Obstetrics (+) Pregnancy                             Anesthesia Physical Anesthesia Plan  ASA: II  Anesthesia Plan: Epidural   Post-op Pain Management:    Induction:   PONV Risk Score and Plan:   Airway Management Planned:   Additional Equipment:   Intra-op Plan:   Post-operative Plan:   Informed Consent: I have reviewed the patients History and Physical, chart, labs and discussed the procedure including the risks, benefits and alternatives for the proposed anesthesia with the patient or authorized representative who has indicated his/her understanding and acceptance.     Plan Discussed with:   Anesthesia Plan Comments:         Anesthesia Quick Evaluation

## 2016-11-14 NOTE — Anesthesia Postprocedure Evaluation (Signed)
Anesthesia Post Note  Patient: Min Kniskern  Procedure(s) Performed: * No procedures listed *     Patient location during evaluation: Mother Baby Anesthesia Type: Epidural Level of consciousness: awake Pain management: pain level controlled Vital Signs Assessment: post-procedure vital signs reviewed and stable Respiratory status: spontaneous breathing Cardiovascular status: stable Postop Assessment: no headache, no backache, epidural receding and patient able to bend at knees Anesthetic complications: no    Last Vitals:  Vitals:   11/14/16 1100 11/14/16 1130  BP: 102/65 107/63  Pulse: 77 79  Resp: 18 16  Temp:  37.4 C    Last Pain:  Vitals:   11/14/16 1130  TempSrc: Oral  PainSc: 0-No pain   Pain Goal: Patients Stated Pain Goal: 3 (11/14/16 0739)               Laruen Risser     

## 2016-11-14 NOTE — Anesthesia Postprocedure Evaluation (Signed)
Anesthesia Post Note  Patient: Kelsey [name]  Procedure(s) Performed: * No procedures listed *     Patient location during evaluation: Mother Baby Anesthesia Type: Epidural Level of consciousness: awake Pain management: pain level controlled Vital Signs Assessment: post-procedure vital signs reviewed and stable Respiratory status: spontaneous breathing Cardiovascular status: stable Postop Assessment: no headache, no backache, epidural receding and patient able to bend at knees Anesthetic complications: no    Last Vitals:  Vitals:   11/14/16 1100 11/14/16 1130  BP: 102/65 107/63  Pulse: 77 79  Resp: 18 16  Temp:  37.4 C    Last Pain:  Vitals:   11/14/16 1130  TempSrc: Oral  PainSc: 0-No pain   Pain Goal: Patients Stated Pain Goal: 3 (11/14/16 0739)               Edison PaceWILKERSON,Damone Fancher

## 2016-11-15 LAB — BIRTH TISSUE RECOVERY COLLECTION (PLACENTA DONATION)

## 2016-11-15 LAB — CBC
HEMATOCRIT: 29.7 % — AB (ref 36.0–46.0)
HEMOGLOBIN: 9.1 g/dL — AB (ref 12.0–15.0)
MCH: 20.6 pg — ABNORMAL LOW (ref 26.0–34.0)
MCHC: 30.6 g/dL (ref 30.0–36.0)
MCV: 67.3 fL — ABNORMAL LOW (ref 78.0–100.0)
Platelets: 287 10*3/uL (ref 150–400)
RBC: 4.41 MIL/uL (ref 3.87–5.11)
RDW: 18.2 % — ABNORMAL HIGH (ref 11.5–15.5)
WBC: 10.6 10*3/uL — ABNORMAL HIGH (ref 4.0–10.5)

## 2016-11-15 MED ORDER — IBUPROFEN 600 MG PO TABS: 600.0000 mg | ORAL_TABLET | Freq: Four times a day (QID) | ORAL | 0 refills | Status: DC

## 2016-11-15 NOTE — Plan of Care (Signed)
Problem: Education: Goal: Knowledge of condition will improve Patient stated that her nipples are getting sore. No breakdown noted. Provided coconut oil and discussed use. Encouraged patient to call for latch score.

## 2016-11-15 NOTE — Progress Notes (Signed)
Post Partum Day 1 Subjective:  Well. Lochia are normal. Voiding, ambulating, tolerating normal diet. nursing going well.  Objective: Blood pressure (!) 103/56, pulse (!) 57, temperature 98.2 F (36.8 C), temperature source Oral, resp. rate 16, height 5\' 6"  (1.676 m), weight 154 lb (69.9 kg), SpO2 100 %, unknown if currently breastfeeding.  Physical Exam:  General: normal Lochia: appropriate Uterine Fundus: A U firm non-tender  Extremities: No evidence of DVT seen on physical exam. Edema none     Recent Labs  11/14/16 0737 11/15/16 0505  HGB 10.0* 9.1*  HCT 32.6* 29.7*    Assessment/Plan: Normal Post-partum. Continue routine post-partum care. Anticipate discharge tomorrow    LOS: 1 day   Elmore GuiseLori A Arrow Tomko CNM 11/15/2016, 10:51 AM

## 2016-11-15 NOTE — Discharge Summary (Signed)
Obstetric Discharge Summary Reason for Admission: Labor Prenatal Procedures: none Intrapartum Procedures:svd Postpartum Procedures: none Complications-Operative and Postpartum: none  11/14/16 @ 946am Female 300ccEBL; NO LAC delivered by Dr. Shelbie Ammonsoberts  Hospital Course:  Principal Problem:   Normal vaginal delivery   Kelsey LevansChaimae [name] is a 26 y.o. [id number] s/p SVD.  Patient was admitted 11/14/16  She has postpartum course that was Uncomplicated.The pt feels ready to go home and  will be discharged with outpatient follow-up at 6 weeks  Today: No acute events overnight.  Pt denies problems with ambulating, voiding or po intake.  She denies nausea or vomiting.  Pain is well controlled.  Lochia Small;   Plan for birth control is Paragurad  Method of Feeding: breast  Physical Exam:  BP (!) 103/56 (BP Location: Right Arm)   Pulse (!) 57   Temp 98.2 F (36.8 C) (Oral)   Resp 16   Ht 5\' 6"  (1.676 m)   Wt 154 lb (69.9 kg)   SpO2 99%   Breastfeeding? Unknown   BMI 24.86 kg/m  General: alert oriented x 3 Lochia: appropriate small Uterine Fundus: Firm @ U DVT Evaluation: wnl H/H: Lab Results  Component Value Date/Time   HGB 9.1 (L) 11/15/2016 05:05 AM   HGB 11.5 02/27/2014   HCT 29.7 (L) 11/15/2016 05:05 AM   HCT 36 02/27/2014    Discharge Diagnoses: SVD delivered Discharge Information: Date: 11/15/2016 Activity: pelvic rest Diet: routine  Medications: Ibuprofen Breast feeding:  Yes Condition: stable Instructions: refer to handout Discharge ZH:YQMVto:home   Discharge Instructions    Activity as tolerated    Complete by:  As directed    Call MD for:  difficulty breathing, headache or visual disturbances    Complete by:  As directed    Call MD for:  extreme fatigue    Complete by:  As directed    Call MD for:  hives    Complete by:  As directed    Call MD for:  persistant dizziness or light-headedness    Complete by:  As directed    Call MD for:  persistant nausea and vomiting     Complete by:  As directed    Call MD for:  severe uncontrolled pain    Complete by:  As directed    Call MD for:  temperature >100.4    Complete by:  As directed    Diet general    Complete by:  As directed    Discharge instructions    Complete by:  As directed    routine   Sexual acrtivity    Complete by:  As directed    6 weeks     Allergies as of 11/15/2016   No Known Allergies     Medication List    TAKE these medications   docusate sodium 100 MG capsule Commonly known as:  COLACE Take 100 mg by mouth daily.   ferrous sulfate 325 (65 FE) MG tablet Take 1 tablet (325 mg total) by mouth 2 (two) times daily with a meal. For two weeks then once daily for 4 weeks.   ibuprofen 600 MG tablet Commonly known as:  ADVIL,MOTRIN Take 1 tablet (600 mg total) by mouth every 6 (six) hours as needed. What changed:  Another medication with the same name was added. Make sure you understand how and when to take each.   ibuprofen 600 MG tablet Commonly known as:  ADVIL,MOTRIN Take 1 tablet (600 mg total) by mouth every 6 (six) hours.  What changed:  You were already taking a medication with the same name, and this prescription was added. Make sure you understand how and when to take each.      Follow-up Information    [facility]. Call.   Specialty:  Obstetrics and Gynecology Why:  to make appointment for 6 weeks postpartum. Tell them what form of birth control you would like to have Contact information: [address] [place] [phone]           Rhea PinkLori A [place] Noori, CNM 11/15/2016,3:55 PM

## 2016-11-15 NOTE — Discharge Instructions (Signed)

## 2016-12-05 ENCOUNTER — Observation Stay (HOSPITAL_COMMUNITY)
Admission: AD | Admit: 2016-12-05 | Discharge: 2016-12-06 | Disposition: A | Discharge status: Home / Self Care | Payer: BLUE CROSS/BLUE SHIELD | Source: Ambulatory Visit | Attending: Obstetrics and Gynecology | Admitting: Obstetrics and Gynecology

## 2016-12-05 ENCOUNTER — Encounter (HOSPITAL_COMMUNITY): Payer: Self-pay | Admitting: *Deleted

## 2016-12-05 DIAGNOSIS — O9089 Other complications of the puerperium, not elsewhere classified: Principal | ICD-10-CM | POA: Insufficient documentation

## 2016-12-05 DIAGNOSIS — R509 Fever, unspecified: Secondary | ICD-10-CM | POA: Insufficient documentation

## 2016-12-05 DIAGNOSIS — O8612 Endometritis following delivery: Secondary | ICD-10-CM | POA: Diagnosis present

## 2016-12-05 DIAGNOSIS — R1030 Lower abdominal pain, unspecified: Secondary | ICD-10-CM | POA: Diagnosis not present

## 2016-12-05 LAB — COMPREHENSIVE METABOLIC PANEL
ALT: 13 U/L — AB (ref 14–54)
AST: 16 U/L (ref 15–41)
Albumin: 4 g/dL (ref 3.5–5.0)
Alkaline Phosphatase: 103 U/L (ref 38–126)
Anion gap: 12 (ref 5–15)
BUN: 12 mg/dL (ref 6–20)
CHLORIDE: 100 mmol/L — AB (ref 101–111)
CO2: 21 mmol/L — ABNORMAL LOW (ref 22–32)
CREATININE: 0.73 mg/dL (ref 0.44–1.00)
Calcium: 8.8 mg/dL — ABNORMAL LOW (ref 8.9–10.3)
GFR calc Af Amer: >60 mL/min (ref >60)
Glucose, Bld: 111 mg/dL — ABNORMAL HIGH (ref 65–99)
Potassium: 3.7 mmol/L (ref 3.5–5.1)
Sodium: 133 mmol/L — ABNORMAL LOW (ref 135–145)
Total Bilirubin: 1.2 mg/dL (ref 0.3–1.2)
Total Protein: 8.2 g/dL — ABNORMAL HIGH (ref 6.5–8.1)

## 2016-12-05 LAB — CBC WITH DIFFERENTIAL/PLATELET
Basophils Absolute: 0 10*3/uL (ref 0.0–0.1)
Basophils Relative: 0 %
EOS ABS: 0 10*3/uL (ref 0.0–0.7)
EOS PCT: 0 %
HCT: 36.4 % (ref 36.0–46.0)
HEMOGLOBIN: 11.3 g/dL — AB (ref 12.0–15.0)
LYMPHS ABS: 1.2 10*3/uL (ref 0.7–4.0)
Lymphocytes Relative: 9 %
MCH: 21.6 pg — AB (ref 26.0–34.0)
MCHC: 31 g/dL (ref 30.0–36.0)
MCV: 69.7 fL — ABNORMAL LOW (ref 78.0–100.0)
MONO ABS: 0.3 10*3/uL (ref 0.1–1.0)
MONOS PCT: 2 %
Neutro Abs: 11.3 10*3/uL — ABNORMAL HIGH (ref 1.7–7.7)
Neutrophils Relative %: 88 %
PLATELETS: 265 10*3/uL (ref 150–400)
RBC: 5.22 MIL/uL — ABNORMAL HIGH (ref 3.87–5.11)
RDW: 22.4 % — ABNORMAL HIGH (ref 11.5–15.5)
WBC: 12.9 10*3/uL — ABNORMAL HIGH (ref 4.0–10.5)

## 2016-12-05 LAB — URINALYSIS, ROUTINE W REFLEX MICROSCOPIC
Bilirubin Urine: NEGATIVE
Glucose, UA: NEGATIVE mg/dL
KETONES UR: NEGATIVE mg/dL
NITRITE: NEGATIVE
PROTEIN: NEGATIVE mg/dL
Specific Gravity, Urine: 1.013 (ref 1.005–1.030)
pH: 6 (ref 5.0–8.0)

## 2016-12-05 LAB — WET PREP, GENITAL
Clue Cells Wet Prep HPF POC: NONE SEEN
SPERM: NONE SEEN
Trich, Wet Prep: NONE SEEN
YEAST WET PREP: NONE SEEN

## 2016-12-05 MED ORDER — ACETAMINOPHEN 500 MG PO TABS
1000.0000 mg | ORAL_TABLET | Freq: Once | ORAL | Status: AC
  Administered 2016-12-05: 1000 mg via ORAL
  Filled 2016-12-05: qty 2

## 2016-12-05 NOTE — MAU Note (Signed)
PT  SAYS  SHE DEL VAG  ON 7-30.   BREAST/ BOTTLE FEEDING.    YESTERDAY  SHE DEVELOPED DIZZINESS  AND FEVER-  DOESN'T KNOW WHAT TEMP WAS.  TOOK IBUPROFEN  AT 2300- 2 TABS .  DID NOT CALL DR TONIGHT

## 2016-12-06 ENCOUNTER — Encounter (HOSPITAL_COMMUNITY): Payer: Self-pay

## 2016-12-06 ENCOUNTER — Observation Stay (HOSPITAL_COMMUNITY): Payer: BLUE CROSS/BLUE SHIELD

## 2016-12-06 DIAGNOSIS — R509 Fever, unspecified: Secondary | ICD-10-CM | POA: Diagnosis present

## 2016-12-06 DIAGNOSIS — O8612 Endometritis following delivery: Secondary | ICD-10-CM

## 2016-12-06 HISTORY — DX: Endometritis following delivery: O86.12

## 2016-12-06 LAB — CBC WITH DIFFERENTIAL/PLATELET
BASOS ABS: 0 10*3/uL (ref 0.0–0.1)
Basophils Relative: 0 %
Eosinophils Absolute: 0.1 10*3/uL (ref 0.0–0.7)
Eosinophils Relative: 1 %
HEMATOCRIT: 35.4 % — AB (ref 36.0–46.0)
Hemoglobin: 10.9 g/dL — ABNORMAL LOW (ref 12.0–15.0)
LYMPHS PCT: 20 %
Lymphs Abs: 2 10*3/uL (ref 0.7–4.0)
MCH: 21.5 pg — ABNORMAL LOW (ref 26.0–34.0)
MCHC: 30.8 g/dL (ref 30.0–36.0)
MCV: 69.7 fL — AB (ref 78.0–100.0)
MONO ABS: 0.2 10*3/uL (ref 0.1–1.0)
Monocytes Relative: 2 %
NEUTROS ABS: 8.1 10*3/uL — AB (ref 1.7–7.7)
Neutrophils Relative %: 77 %
Platelets: 237 10*3/uL (ref 150–400)
RBC: 5.08 MIL/uL (ref 3.87–5.11)
RDW: 22 % — AB (ref 11.5–15.5)
WBC: 10.4 10*3/uL (ref 4.0–10.5)

## 2016-12-06 LAB — POCT PREGNANCY, URINE: PREG TEST UR: NEGATIVE

## 2016-12-06 MED ORDER — IOPAMIDOL (ISOVUE-300) INJECTION 61%
100.0000 mL | Freq: Once | INTRAVENOUS | Status: AC | PRN
Start: 2016-12-06 — End: 2016-12-06
  Administered 2016-12-06: 100 mL via INTRAVENOUS

## 2016-12-06 MED ORDER — DOXYCYCLINE HYCLATE 100 MG IV SOLR
100.0000 mg | Freq: Two times a day (BID) | INTRAVENOUS | Status: DC
  Administered 2016-12-06: 100 mg via INTRAVENOUS
  Filled 2016-12-06 (×2): qty 100

## 2016-12-06 MED ORDER — AMOXICILLIN-POT CLAVULANATE 875-125 MG PO TABS: 1.0000 | ORAL_TABLET | Freq: Two times a day (BID) | ORAL | 0 refills | Status: AC

## 2016-12-06 MED ORDER — PRENATAL MULTIVITAMIN CH
1.0000 | ORAL_TABLET | Freq: Every day | ORAL | Status: DC
  Filled 2016-12-06 (×2): qty 1

## 2016-12-06 MED ORDER — ZOLPIDEM TARTRATE 5 MG PO TABS: 5.0000 mg | ORAL_TABLET | Freq: Every evening | ORAL | Status: DC | PRN

## 2016-12-06 MED ORDER — IOPAMIDOL (ISOVUE-300) INJECTION 61%
30.0000 mL | INTRAVENOUS | Status: AC
  Administered 2016-12-06 (×2): 30 mL via ORAL

## 2016-12-06 MED ORDER — DOCUSATE SODIUM 100 MG PO CAPS
100.0000 mg | ORAL_CAPSULE | Freq: Every day | ORAL | Status: DC
  Administered 2016-12-06: 100 mg via ORAL
  Filled 2016-12-06 (×2): qty 1

## 2016-12-06 MED ORDER — DEXTROSE 5 % IV SOLN
2.0000 g | Freq: Four times a day (QID) | INTRAVENOUS | Status: DC
  Administered 2016-12-06 (×2): 2 g via INTRAVENOUS
  Filled 2016-12-06 (×5): qty 2

## 2016-12-06 MED ORDER — CALCIUM CARBONATE ANTACID 500 MG PO CHEW: 2.0000 | CHEWABLE_TABLET | ORAL | Status: DC | PRN

## 2016-12-06 MED ORDER — DEXTROSE 5 % IV SOLN: 2.0000 g | Freq: Two times a day (BID) | INTRAVENOUS | Status: DC

## 2016-12-06 MED ORDER — ACETAMINOPHEN 325 MG PO TABS
650.0000 mg | ORAL_TABLET | ORAL | Status: DC | PRN
  Administered 2016-12-06: 650 mg via ORAL
  Filled 2016-12-06: qty 2

## 2016-12-06 NOTE — Discharge Summary (Signed)
Physician Discharge Summary   Patient ID: Kelsey [name] [id number] 26 y.o. 1990-07-30  Admit date: 12/05/2016  Discharge date and time: No discharge date for patient encounter.   Admitting Physician: Silverio Lay, MD   Discharge Physician: Dr. Stefano Gaul  Admission Diagnoses: PP FEVER BACK PAIN AND ABD PAIN  Discharge Diagnoses: Abd pain with fever  Admission Condition: good  Discharged Condition: good  Indication for Admission: Fever and pain  Hospital Course: Pt was evaluated in MAU last night and was noted to have right sided pelvic pain and a fever.  IV antibiotics started.  GC/Chlamydia cultures pending,  WBC wnl.  CT scan negative.  Wet mount negative.  PT currently afebrile.  Blood cultures pending.  Pain on on 0-10 scale a 4.  Pt not using pain medication.  Pt will be discharged home on oral Augmentin for 7 days.  Pt to follow up in office if pain increases or fever continues.  Consults: None  Significant Diagnostic Studies: labs: neg, microbiology: neg and CT scan negative  Treatments: IV antibiotics  Discharge Exam:  BP 106/68 (BP Location: Right Arm)   Pulse 85   Temp 98 F (36.7 C) (Oral)   Resp 18   Wt 62.9 kg (138 lb 12 oz)   SpO2 100%   BMI 22.39 kg/m   General Appearance:    Alert, cooperative, no distress, appears stated age  Head:    Normocephalic, without obvious abnormality, atraumatic              Neck:   Supple, symmetrical, trachea midline, no adenopathy;    thyroid: wnl  Back:     Symmetric, no curvature, ROM normal, no CVA tenderness  Lungs:     Clear to auscultation bilaterally, respirations unlabored      Heart:    Regular rate and rhythm, S1 and S2 normal, no murmur, rub   or gallop  Breast Exam:    No tenderness, masses, or nipple abnormality  Abdomen:     Soft, non-tender, bowel sounds active all four quadrants,    no masses, no organomegaly              Skin:   Skin color, texture, turgor normal, no rashes or lesions           Disposition: 01-Home or Self Care  Patient Instructions:  Medication Augmentin 875mg  BID x 7 days sent to pharmacy. Activity: activity as tolerated Diet: regular diet Wound Care: none needed Follow up as indicated Signed: Henderson Newcomer Prothero 12/06/2016 9:00 AM

## 2016-12-06 NOTE — H&P (Signed)
Kelsey [name] is a 26 y.o. female, G3P2012 who is 21 days PP s/p SVD without lacerations who presents for fever.  Patient reports that her fever started last night with chills and nausea.  Patient also reports lower abdominal pain specifically on the right side.  Patient states that she took ibuprofen at 2300 with onset of fever, but nothing throughout the day.  Patient denies vaginal bleeding, issues with constipation and diarrhea, or urination.  Patient states she is breastfeeding every 1-2 hours and feels that breast are appropriately soft upon completion of feedings.    Patient Active Problem List   Diagnosis Date Noted  . Postpartum endometritis 12/06/2016  . Normal vaginal delivery 11/10/2014  . Hx of rheumatic fever 09/10/2014    OB History    Gravida Para Term Preterm AB Living   3 2 2   1 2   SAB TAB Ectopic Multiple Live Births   1     0 2     Past Medical History:  Diagnosis Date  . Anemia   . Rheumatic fever    History reviewed. No pertinent surgical history. Family History: family history is not on file. Social History:  reports that she has never smoked. She has never used smokeless tobacco. She reports that she does not drink alcohol or use drugs.   Prenatal Transfer Tool  Maternal Diabetes: No Genetic Screening: Declined Maternal Ultrasounds/Referrals: Normal Fetal Ultrasounds or other Referrals:  None Maternal Substance Abuse:  No Significant Maternal Medications:  None Significant Maternal Lab Results:  Lab values include: Group B Strep negative Other Comments:  None  ROS:  See Above  No Known Allergies    Blood pressure 105/62, pulse (!) 107, temperature (!) 102.3 F (39.1 C), resp. rate 20, weight 62.9 kg (138 lb 12 oz), unknown if currently breastfeeding.  Physical Exam  Constitutional: She is oriented to person, place, and time. She appears well-developed and well-nourished. No distress.  HENT:  Head: Normocephalic and atraumatic.  Eyes:  Conjunctivae are normal.  Neck: Normal range of motion.  Cardiovascular: Normal rate, regular rhythm and normal heart sounds.   Respiratory: Effort normal and breath sounds normal.  GI: Soft. Bowel sounds are normal.  Genitourinary: Uterus is enlarged. Cervix exhibits no motion tenderness, no discharge and no friability. Right adnexum displays tenderness. No bleeding in the vagina. No vaginal discharge found.  Genitourinary Comments: Sterile Speculum Exam: -Vaginal Vault: Scant amt thin white discharge -wet prep collected -Cervix:Appears closed, No discharge from os -Bimanual Exam: Right side tenderness with palpation-UTA adnexa d/t patient discomfort. Uterus feels slightly enlarged ~6wk   Musculoskeletal: Normal range of motion. She exhibits no edema.  Neurological: She is alert and oriented to person, place, and time.  Skin: Skin is warm and dry.  Psychiatric: She has a normal mood and affect. Her behavior is normal.   Prenatal labs: ABO, Rh: --/--/AB POS (07/30 0735) Antibody: NEG (07/30 0735) Rubella:  Immune RPR: Non Reactive (07/30 0737)  HBsAg: Negative (12/04 0000)  HIV: Non-reactive (12/04 0000)  GBS: Negative (07/09 0000) GC:  In Process Chlamydia:  In Process    Assessment Postpartum Day 21 Suspected Endometritis R/O Appendicitis Fever of Unknown Origin  Plan: Admit to [facility] for observation per consult with Dr.S.  Rivard Tylenol for fever as appropriate Blood cultures Repeat CBC with differential in AM CT Scan for Right Side Pain IV Antibiotics: Doxycycline 100mg Q 12 hrs & Cefoxitin 2 g Q6hrs In room to discuss POC with patient and family   who is agreeable No q/c at current Will update MD as appropriate  Jessica L EmlyCNM, MSN 12/06/2016, 12:54 AM     

## 2016-12-06 NOTE — H&P (Signed)
Kelsey [name] is a 26 y.o. female, D1V6160 who is 21 days PP s/p SVD without lacerations who presents for fever.  Patient reports that her fever started last night with chills and nausea.  Patient also reports lower abdominal pain specifically on the right side.  Patient states that she took ibuprofen at 2300 with onset of fever, but nothing throughout the day.  Patient denies vaginal bleeding, issues with constipation and diarrhea, or urination.  Patient states she is breastfeeding every 1-2 hours and feels that breast are appropriately soft upon completion of feedings.    Patient Active Problem List   Diagnosis Date Noted  . Postpartum endometritis 12/06/2016  . Normal vaginal delivery 11/10/2014  . Hx of rheumatic fever 09/10/2014    OB History    Gravida Para Term Preterm AB Living   3 2 2   1 2    SAB TAB Ectopic Multiple Live Births   1     0 2     Past Medical History:  Diagnosis Date  . Anemia   . Rheumatic fever    History reviewed. No pertinent surgical history. Family History: family history is not on file. Social History:  reports that she has never smoked. She has never used smokeless tobacco. She reports that she does not drink alcohol or use drugs.   Prenatal Transfer Tool  Maternal Diabetes: No Genetic Screening: Declined Maternal Ultrasounds/Referrals: Normal Fetal Ultrasounds or other Referrals:  None Maternal Substance Abuse:  No Significant Maternal Medications:  None Significant Maternal Lab Results:  Lab values include: Group B Strep negative Other Comments:  None  ROS:  See Above  No Known Allergies    Blood pressure 105/62, pulse (!) 107, temperature (!) 102.3 F (39.1 C), resp. rate 20, weight 62.9 kg (138 lb 12 oz), unknown if currently breastfeeding.  Physical Exam  Constitutional: She is oriented to person, place, and time. She appears well-developed and well-nourished. No distress.  HENT:  Head: Normocephalic and atraumatic.  Eyes:  Conjunctivae are normal.  Neck: Normal range of motion.  Cardiovascular: Normal rate, regular rhythm and normal heart sounds.   Respiratory: Effort normal and breath sounds normal.  GI: Soft. Bowel sounds are normal.  Genitourinary: Uterus is enlarged. Cervix exhibits no motion tenderness, no discharge and no friability. Right adnexum displays tenderness. No bleeding in the vagina. No vaginal discharge found.  Genitourinary Comments: Sterile Speculum Exam: -Vaginal Vault: Scant amt thin white discharge -wet prep collected -Cervix:Appears closed, No discharge from os -Bimanual Exam: Right side tenderness with palpation-UTA adnexa d/t patient discomfort. Uterus feels slightly enlarged ~6wk   Musculoskeletal: Normal range of motion. She exhibits no edema.  Neurological: She is alert and oriented to person, place, and time.  Skin: Skin is warm and dry.  Psychiatric: She has a normal mood and affect. Her behavior is normal.   Prenatal labs: ABO, Rh: --/--/AB POS (07/30 0735) Antibody: NEG (07/30 0735) Rubella:  Immune RPR: Non Reactive (07/30 0737)  HBsAg: Negative (12/04 0000)  HIV: Non-reactive (12/04 0000)  GBS: Negative (07/09 0000) GC:  In Process Chlamydia:  In Process    Assessment Postpartum Day 21 Suspected Endometritis R/O Appendicitis Fever of Unknown Origin  Plan: Admit to [facility] for observation per consult with Dr.S.  Rivard Tylenol for fever as appropriate Blood cultures Repeat CBC with differential in AM CT Scan for Right Side Pain IV Antibiotics: Doxycycline 100mg  Q 12 hrs & Cefoxitin 2 g Q6hrs In room to discuss POC with patient and family  who is agreeable No q/c at current Will update MD as appropriate  Joellyn Quails, MSN 12/06/2016, 12:54 AM

## 2016-12-06 NOTE — Op Note (Deleted)
OPERATIVE NOTE  Kelsey [name]  DOB:    January 17, 1991  MRN:    [id number]  CSN:    [id number]  Date of Surgery:  12/06/2016  Preoperative Diagnosis:  Postmenopausal bleeding  Endocervical polyp  Postoperative Diagnosis:  Postmenopausal bleeding  Endometrial polyp  Procedure:  Hysteroscopy with resection of endometrial polyps Dilatation and curettage  Surgeon:  Leonard Schwartz, M.D.  Assistant:  None  Anesthetic:  General  Disposition:  The patient is a 26 y.o.-year-old female who presents with an episode of postmenopausal bleeding. An ultrasound showed an endocervical polyp. She understands the indications for her surgical procedure. She accepts the risk of, but not limited to, anesthetic complications, bleeding, infections, and possible damage to the surrounding organs.  Findings:  On examination under anesthesia the uterus was normal size. No adnexal masses were appreciated. No adnexal masses were appreciated. No parametrial disease was appreciated. The uterus sounded to 7 cm. The patient was noted to have 0.7 cm endometrial polyp.  Procedure:  The patient was taken to the operating room where a general anesthetic was given. The perineum and vagina were prepped with Betadine. The bladder was drained of urine. The patient was sterilely draped. Examination under anesthesia was performed. A paracervical block was placed using 10 cc of half percent Marcaine with epinephrine. An endocervical curettage was performed. The cervix was gently dilated. The diagnostic hysteroscope was inserted and the cavity was carefully inspected. Pictures were taken. Findings included: A 0.7 cm endometrial polyp. The diagnostic hysteroscope was removed. The cervix was dilated further. The operative hysteroscope was inserted. The polyps were resected using a single loop. The cavity was then curetted using a sharp curet. The cavity was felt to be clean at the end of our procedure. Hemostasis  was adequate. All instruments were removed. The examination was repeated and the uterus was noted to be firm. Sponge, and needle counts were correct. The estimated blood loss for the procedure was 5 cc. The estimated fluid deficit loss 545 cc, but fluid was noted on the operating room floor. The patient was awakened from her anesthetic without difficulty. She was returned to the supine position and and transported to the recovery room in stable condition. The endocervical curettings, endometrial resections, and endometrial curettings were sent to pathology.  Followup instructions:  The patient will return to see Dr. Stefano Gaul in 2 weeks. She was given a copy of the postoperative instructions for patients who've undergone hysteroscopy.  Discharge medications:  Tylenol 625 mg every 6 hours as needed for pain. Tramadol 50 mg tablets every 6-8 hours as needed for moderate to severe pain.  Leonard Schwartz, M.D.  12/06/2016

## 2016-12-07 LAB — CULTURE, OB URINE

## 2016-12-07 LAB — TYPE AND SCREEN
ABO/RH(D): AB POS
Antibody Screen: NEGATIVE

## 2016-12-11 LAB — CULTURE, BLOOD (ROUTINE X 2)
Culture: NO GROWTH
Culture: NO GROWTH

## 2019-01-28 ENCOUNTER — Other Ambulatory Visit: Payer: Self-pay

## 2019-01-28 DIAGNOSIS — Z20822 Contact with and (suspected) exposure to covid-19: Secondary | ICD-10-CM

## 2019-01-29 LAB — NOVEL CORONAVIRUS, NAA: SARS-CoV-2, NAA: NOT DETECTED

## 2021-12-07 ENCOUNTER — Encounter (HOSPITAL_COMMUNITY): Payer: Self-pay | Admitting: *Deleted

## 2021-12-07 ENCOUNTER — Inpatient Hospital Stay (HOSPITAL_COMMUNITY): Payer: Commercial Managed Care - HMO

## 2021-12-07 ENCOUNTER — Inpatient Hospital Stay (HOSPITAL_COMMUNITY)
Admission: AD | Admit: 2021-12-07 | Discharge: 2021-12-08 | Disposition: A | Discharge status: Home / Self Care | Payer: Commercial Managed Care - HMO | Attending: Obstetrics and Gynecology | Admitting: Obstetrics and Gynecology

## 2021-12-07 DIAGNOSIS — N83202 Unspecified ovarian cyst, left side: Secondary | ICD-10-CM | POA: Insufficient documentation

## 2021-12-07 DIAGNOSIS — R109 Unspecified abdominal pain: Secondary | ICD-10-CM | POA: Diagnosis not present

## 2021-12-07 DIAGNOSIS — O3680X0 Pregnancy with inconclusive fetal viability, not applicable or unspecified: Secondary | ICD-10-CM | POA: Insufficient documentation

## 2021-12-07 DIAGNOSIS — O3481 Maternal care for other abnormalities of pelvic organs, first trimester: Secondary | ICD-10-CM | POA: Insufficient documentation

## 2021-12-07 DIAGNOSIS — O26891 Other specified pregnancy related conditions, first trimester: Secondary | ICD-10-CM | POA: Insufficient documentation

## 2021-12-07 DIAGNOSIS — Z3201 Encounter for pregnancy test, result positive: Secondary | ICD-10-CM | POA: Insufficient documentation

## 2021-12-07 DIAGNOSIS — D649 Anemia, unspecified: Secondary | ICD-10-CM | POA: Insufficient documentation

## 2021-12-07 DIAGNOSIS — Z3A01 Less than 8 weeks gestation of pregnancy: Secondary | ICD-10-CM | POA: Diagnosis not present

## 2021-12-07 LAB — WET PREP, GENITAL
Clue Cells Wet Prep HPF POC: NONE SEEN
Sperm: NONE SEEN
Trich, Wet Prep: NONE SEEN
WBC, Wet Prep HPF POC: <10 (ref <10)
Yeast Wet Prep HPF POC: NONE SEEN

## 2021-12-07 LAB — CBC
HCT: 33.6 % — ABNORMAL LOW (ref 36.0–46.0)
Hemoglobin: 10.8 g/dL — ABNORMAL LOW (ref 12.0–15.0)
MCH: 25.9 pg — ABNORMAL LOW (ref 26.0–34.0)
MCHC: 32.1 g/dL (ref 30.0–36.0)
MCV: 80.6 fL (ref 80.0–100.0)
Platelets: 249 10*3/uL (ref 150–400)
RBC: 4.17 MIL/uL (ref 3.87–5.11)
RDW: 14.2 % (ref 11.5–15.5)
WBC: 8.9 10*3/uL (ref 4.0–10.5)
nRBC: 0 % (ref 0.0–0.2)

## 2021-12-07 LAB — POCT PREGNANCY, URINE: Preg Test, Ur: POSITIVE — AB

## 2021-12-07 LAB — HCG, QUANTITATIVE, PREGNANCY: hCG, Beta Chain, Quant, S: 5129 m[IU]/mL — ABNORMAL HIGH (ref <5)

## 2021-12-07 NOTE — MAU Note (Signed)
.  Kelsey [name] is a 31 y.o. at Unknown here in MAU reporting pain in lower abdomen for a week. Was told 5-25months ago has bilateral ovarian cysts. Last time had pain like this in abdomen had miscarriage. No vag bleeding LMP: 07/21 Onset of complaint: one week Pain score: 7 Vitals:   12/07/21 2159  BP: 116/75  Pulse: (!) 102  Resp: 17  Temp: 97.9 F (36.6 C)  SpO2: 100%     FHT:n/a Lab orders placed from triage:  u/a

## 2021-12-07 NOTE — MAU Provider Note (Signed)
History     [id number]  Arrival date and time: 12/07/21 2145    Chief Complaint  Patient presents with   Abdominal Pain     HPI Kelsey [name] is a 31 y.o. at [redacted]w[redacted]d by LMP who presents for abdominal pain. Reports cramping throughout lower abdomen for the last week. Pain is intermittent. Had positive pregnancy test at home. Denies fever, n/v/d, dysuria, vaginal bleeding, or vaginal discharge.    OB History     Gravida  4   Para  2   Term  2   Preterm      AB  1   Living  2      SAB  1   IAB      Ectopic      Multiple  0   Live Births  2           Past Medical History:  Diagnosis Date   Anemia    Rheumatic fever     History reviewed. No pertinent surgical history.  No family history on file.  Allergies  Allergen Reactions   No Known Allergies     No current facility-administered medications on file prior to encounter.   No current outpatient medications on file prior to encounter.     ROS Pertinent positives and negative per HPI, all others reviewed and negative  Physical Exam   BP 116/75 (BP Location: Left Arm)   Pulse (!) 102   Temp 97.9 F (36.6 C)   Resp 17   Ht 5\' 6"  (1.676 m)   Wt 68.9 kg   LMP 11/05/2021   SpO2 100%   BMI 24.53 kg/m   Patient Vitals for the past 24 hrs:  BP Temp Pulse Resp SpO2 Height Weight  12/07/21 2159 116/75 97.9 F (36.6 C) (!) 102 17 100 % 5\' 6"  (1.676 m) 68.9 kg    Physical Exam Vitals and nursing note reviewed.  Constitutional:      General: She is not in acute distress.    Appearance: She is well-developed.  HENT:     Head: Normocephalic and atraumatic.  Pulmonary:     Effort: Pulmonary effort is normal. No respiratory distress.  Abdominal:     General: Abdomen is flat.     Palpations: Abdomen is soft.     Tenderness: There is abdominal tenderness in the suprapubic area. There is no guarding or rebound.  Skin:    General: Skin is warm and dry.  Neurological:     Mental Status: She  is alert.      Labs Results for orders placed or performed during the hospital encounter of 12/07/21 (from the past 24 hour(s))  Pregnancy, urine POC     Status: Abnormal   Collection Time: 12/07/21  9:59 PM  Result Value Ref Range   Preg Test, Ur POSITIVE (A) NEGATIVE  Wet prep, genital     Status: None   Collection Time: 12/07/21 10:15 PM   Specimen: Vaginal  Result Value Ref Range   Yeast Wet Prep HPF POC NONE SEEN NONE SEEN   Trich, Wet Prep NONE SEEN NONE SEEN   Clue Cells Wet Prep HPF POC NONE SEEN NONE SEEN   WBC, Wet Prep HPF POC <10 <10   Sperm NONE SEEN   CBC     Status: Abnormal   Collection Time: 12/07/21 10:53 PM  Result Value Ref Range   WBC 8.9 4.0 - 10.5 K/uL   RBC 4.17 3.87 - 5.11 MIL/uL  Hemoglobin 10.8 (L) 12.0 - 15.0 g/dL   HCT 70.9 (L) 62.8 - 36.6 %   MCV 80.6 80.0 - 100.0 fL   MCH 25.9 (L) 26.0 - 34.0 pg   MCHC 32.1 30.0 - 36.0 g/dL   RDW 29.4 76.5 - 46.5 %   Platelets 249 150 - 400 K/uL   nRBC 0.0 0.0 - 0.2 %  hCG, quantitative, pregnancy     Status: Abnormal   Collection Time: 12/07/21 10:53 PM  Result Value Ref Range   hCG, Beta Chain, Quant, S 5,129 (H) <5 mIU/mL    Imaging US OB LESS THAN 14 WEEKS WITH OB TRANSVAGINAL  Result Date: 12/07/2021 CLINICAL DATA:  Pregnant patient with lower abdominal pain. EXAM: OBSTETRIC <14 WK [place] AND TRANSVAGINAL OB US TECHNIQUE: Both transabdominal and transvaginal ultrasound examinations were performed for complete evaluation of the gestation as well as the maternal uterus, adnexal regions, and pelvic cul-de-sac. Transvaginal technique was performed to assess early pregnancy. COMPARISON:  None Available. FINDINGS: Intrauterine gestational sac: Single Yolk sac:  Not Visualized. Embryo:  Not Visualized. MSD: 5.5 mm   5 w   2 d Subchorionic hemorrhage:  None visualized. Maternal uterus/adnexae: Anteverted uterus. Single small intrauterine gestational sac. The right ovary is normal. There is a physiologic follicular  cyst in the left ovary measuring 2.6 cm. Ovarian blood flow is seen. No adnexal mass. No pelvic free fluid. IMPRESSION: Probable early intrauterine gestational sac, but no yolk sac, fetal pole, or cardiac activity yet visualized. Recommend follow-up quantitative B-HCG levels and follow-up US in 14 days to assess viability. This recommendation follows SRU consensus guidelines: Diagnostic Criteria for Nonviable Pregnancy Early in the First Trimester. Malva Limes Med 2013; 035:4656-81. Electronically Signed   By: Narda Rutherford M.D.   On: 12/07/2021 23:25    MAU Course  Procedures Lab Orders         Wet prep, genital         Urinalysis, Routine w reflex microscopic         CBC         hCG, quantitative, pregnancy         Pregnancy, urine POC    No orders of the defined types were placed in this encounter.  Imaging Orders         US OB LESS THAN 14 WEEKS WITH OB TRANSVAGINAL     MDM 1 undiagnosed new problem with uncertain prognosis Labs & imaging ordered - results reviewed Images independently reviewed by Dr. Jolayne Panther - IUGS present Assessment and Plan   1. Abdominal pain during pregnancy in first trimester  -negative wet prep -Ultrasound shows IUGS. No yolk sac or fetal pole. Scheduled for viability scan in 2 weeks.  -Simple cyst in left ovary - patient was told about ovarian cyst 3 months ago -Issues with urine sample in the lab - patient sent home prior to results. Will manage accordingly when resulted -GC/CT pending  2. Pregnancy with uncertain fetal viability, single or unspecified fetus   3. [redacted] weeks gestation of pregnancy      Judeth Horn, NP 12/08/21 12:27 AM

## 2021-12-08 DIAGNOSIS — Z3A01 Less than 8 weeks gestation of pregnancy: Secondary | ICD-10-CM

## 2021-12-08 DIAGNOSIS — O3680X0 Pregnancy with inconclusive fetal viability, not applicable or unspecified: Secondary | ICD-10-CM | POA: Diagnosis not present

## 2021-12-08 DIAGNOSIS — O26891 Other specified pregnancy related conditions, first trimester: Secondary | ICD-10-CM

## 2021-12-08 DIAGNOSIS — R109 Unspecified abdominal pain: Secondary | ICD-10-CM | POA: Diagnosis not present

## 2021-12-08 LAB — URINALYSIS, ROUTINE W REFLEX MICROSCOPIC
Bilirubin Urine: NEGATIVE
Glucose, UA: NEGATIVE mg/dL
Hgb urine dipstick: NEGATIVE
Ketones, ur: NEGATIVE mg/dL
Leukocytes,Ua: NEGATIVE
Nitrite: NEGATIVE
Protein, ur: NEGATIVE mg/dL
Specific Gravity, Urine: 1.016 (ref 1.005–1.030)
pH: 7 (ref 5.0–8.0)

## 2021-12-08 LAB — GC/CHLAMYDIA PROBE AMP (~~LOC~~) NOT AT ARMC
Chlamydia: NEGATIVE
Comment: NEGATIVE
Comment: NORMAL
Neisseria Gonorrhea: NEGATIVE

## 2021-12-08 NOTE — Progress Notes (Signed)
Written and verbal d/c instructions given and understanding voiced. 

## 2021-12-08 NOTE — Discharge Instructions (Signed)
Return to care  If you have heavier bleeding that soaks through more than 2 pads per hour for an hour or more If you bleed so much that you feel like you might pass out or you do pass out If you have significant abdominal pain that is not improved with Tylenol   

## 2021-12-16 ENCOUNTER — Inpatient Hospital Stay (HOSPITAL_COMMUNITY): Payer: Commercial Managed Care - HMO

## 2021-12-16 ENCOUNTER — Inpatient Hospital Stay (HOSPITAL_COMMUNITY)
Admission: AD | Admit: 2021-12-16 | Discharge: 2021-12-16 | Disposition: A | Discharge status: Home / Self Care | Payer: Commercial Managed Care - HMO | Attending: Obstetrics and Gynecology | Admitting: Obstetrics and Gynecology

## 2021-12-16 ENCOUNTER — Encounter (HOSPITAL_COMMUNITY): Payer: Self-pay | Admitting: Obstetrics and Gynecology

## 2021-12-16 DIAGNOSIS — N939 Abnormal uterine and vaginal bleeding, unspecified: Secondary | ICD-10-CM

## 2021-12-16 DIAGNOSIS — O26891 Other specified pregnancy related conditions, first trimester: Secondary | ICD-10-CM | POA: Insufficient documentation

## 2021-12-16 DIAGNOSIS — R109 Unspecified abdominal pain: Secondary | ICD-10-CM | POA: Insufficient documentation

## 2021-12-16 DIAGNOSIS — O26851 Spotting complicating pregnancy, first trimester: Secondary | ICD-10-CM | POA: Diagnosis not present

## 2021-12-16 DIAGNOSIS — Z3A01 Less than 8 weeks gestation of pregnancy: Secondary | ICD-10-CM | POA: Diagnosis not present

## 2021-12-16 DIAGNOSIS — Z3491 Encounter for supervision of normal pregnancy, unspecified, first trimester: Secondary | ICD-10-CM

## 2021-12-16 LAB — CBC
HCT: 36.4 % (ref 36.0–46.0)
Hemoglobin: 12.1 g/dL (ref 12.0–15.0)
MCH: 25.9 pg — ABNORMAL LOW (ref 26.0–34.0)
MCHC: 33.2 g/dL (ref 30.0–36.0)
MCV: 77.8 fL — ABNORMAL LOW (ref 80.0–100.0)
Platelets: 295 10*3/uL (ref 150–400)
RBC: 4.68 MIL/uL (ref 3.87–5.11)
RDW: 13.8 % (ref 11.5–15.5)
WBC: 9.4 10*3/uL (ref 4.0–10.5)
nRBC: 0 % (ref 0.0–0.2)

## 2021-12-16 LAB — HCG, QUANTITATIVE, PREGNANCY: hCG, Beta Chain, Quant, S: 57475 m[IU]/mL — ABNORMAL HIGH (ref <5)

## 2021-12-16 NOTE — Progress Notes (Signed)
Cleone Slim CNM in Family Rm to see pt and discuss test results and d/c plan. Pt then d/c home by provider.

## 2021-12-16 NOTE — MAU Note (Signed)
Kelsey [name] is a 31 y.o. at [redacted]w[redacted]d here in MAU reporting: saw a clot of blood last night (quarter?),  since then has been seeing black d/c.  Cramping, like period pain.   Onset of complaint: last night Pain score: 7 Vitals:   12/16/21 1722  BP: 110/72  Pulse: (!) 104  Resp: 16  Temp: 98.6 F (37 C)  SpO2: 100%      Lab orders placed from triage:

## 2021-12-16 NOTE — MAU Provider Note (Signed)
History     CSN: [id number]  Arrival date and time: 12/16/21 1651  Chief Complaint  Patient presents with   Vaginal Bleeding   Abdominal Pain   HPI  Kelsey [name] is a 31 y.o. [id number] at 107w6d who presents for evaluation of vaginal bleeding. Patient reports she passed a clot yesterday and had some brown discharge today. She reports intermittent lower cramping that she rates a 6/10. She denies any pain currently.  She had an ultrasound on 8/22 that showed a gestational sac but no fetal pole yet. She denies any discharge and leaking of fluid. Denies any constipation, diarrhea or any urinary complaints.  OB History     Gravida  4   Para  2   Term  2   Preterm      AB  1   Living  2      SAB  1   IAB      Ectopic      Multiple  0   Live Births  2           Past Medical History:  Diagnosis Date   Anemia    Rheumatic fever     Past Surgical History:  Procedure Laterality Date   TONSILLECTOMY      Family History  Problem Relation Age of Onset   Hypertension Mother    Hypertension Father     Social History   Tobacco Use   Smoking status: Never   Smokeless tobacco: Never  Vaping Use   Vaping Use: Never used  Substance Use Topics   Alcohol use: No   Drug use: No    Allergies:  Allergies  Allergen Reactions   No Known Allergies     Medications Prior to Admission  Medication Sig Dispense Refill Last Dose   promethazine (PHENERGAN) 25 MG tablet Take 25 mg by mouth every 6 (six) hours as needed for nausea or vomiting.   12/15/2021 at 0600    Review of Systems  Constitutional: Negative.  Negative for fatigue and fever.  HENT: Negative.    Respiratory: Negative.  Negative for shortness of breath.   Cardiovascular: Negative.  Negative for chest pain.  Gastrointestinal:  Positive for abdominal pain. Negative for constipation, diarrhea, nausea and vomiting.  Genitourinary:  Positive for vaginal bleeding. Negative for dysuria and vaginal  discharge.  Neurological: Negative.  Negative for dizziness and headaches.   Physical Exam   Blood pressure 110/72, pulse (!) 104, temperature 98.6 F (37 C), temperature source Oral, resp. rate 16, height 5\' 6"  (1.676 m), weight 67.2 kg, last menstrual period 11/05/2021, SpO2 100 %, unknown if currently breastfeeding.  Patient Vitals for the past 24 hrs:  BP Temp Temp src Pulse Resp SpO2 Height Weight  12/16/21 1722 110/72 98.6 F (37 C) Oral (!) 104 16 100 % 5\' 6"  (1.676 m) 67.2 kg    Physical Exam Vitals and nursing note reviewed.  Constitutional:      General: She is not in acute distress.    Appearance: She is well-developed.  HENT:     Head: Normocephalic.  Eyes:     Pupils: Pupils are equal, round, and reactive to light.  Cardiovascular:     Rate and Rhythm: Normal rate and regular rhythm.     Heart sounds: Normal heart sounds.  Pulmonary:     Effort: Pulmonary effort is normal. No respiratory distress.     Breath sounds: Normal breath sounds.  Abdominal:     General: Bowel sounds  are normal. There is no distension.     Palpations: Abdomen is soft.     Tenderness: There is no abdominal tenderness.  Skin:    General: Skin is warm and dry.  Neurological:     Mental Status: She is alert and oriented to person, place, and time.  Psychiatric:        Mood and Affect: Mood normal.        Behavior: Behavior normal.        Thought Content: Thought content normal.        Judgment: Judgment normal.    MAU Course  Procedures  Results for orders placed or performed during the hospital encounter of 12/16/21 (from the past 24 hour(s))  CBC     Status: Abnormal   Collection Time: 12/16/21  5:48 PM  Result Value Ref Range   WBC 9.4 4.0 - 10.5 K/uL   RBC 4.68 3.87 - 5.11 MIL/uL   Hemoglobin 12.1 12.0 - 15.0 g/dL   HCT 96.0 45.4 - 09.8 %   MCV 77.8 (L) 80.0 - 100.0 fL   MCH 25.9 (L) 26.0 - 34.0 pg   MCHC 33.2 30.0 - 36.0 g/dL   RDW 11.9 14.7 - 82.9 %   Platelets 295 150  - 400 K/uL   nRBC 0.0 0.0 - 0.2 %  hCG, quantitative, pregnancy     Status: Abnormal   Collection Time: 12/16/21  5:48 PM  Result Value Ref Range   hCG, Beta Chain, Quant, S 57,475 (H) <5 mIU/mL     US OB Transvaginal  Result Date: 12/16/2021 CLINICAL DATA:  Vaginal bleeding during pregnancy. EXAM: TRANSVAGINAL OB ULTRASOUND TECHNIQUE: Transvaginal ultrasound was performed for complete evaluation of the gestation as well as the maternal uterus, adnexal regions, and pelvic cul-de-sac. COMPARISON:  Obstetrical ultrasound 12/07/2021. FINDINGS: Intrauterine gestational sac: Single Yolk sac:  Visualized. Embryo:  Visualized. Cardiac Activity: Visualized. Heart Rate: 112 bpm CRL:   4.9 mm   6 w 1 d                  [place] EDC: 08/10/2022 Subchorionic hemorrhage:  None visualized. Maternal uterus/adnexae: Bilateral ovaries are visualized. There is a simple anechoic left ovarian cyst measuring up to 2.5 cm. Right ovary appears within normal limits. Small amount of free fluid in the pelvis. IMPRESSION: 1. Single live intrauterine gestation measuring 6 weeks 1 day by crown-rump length. No acute abnormality. 2. 2.5 cm left ovarian simple cysts. 3. Small amount of free fluid in the pelvis. Electronically Signed   By: Darliss Cheney M.D.   On: 12/16/2021 19:10     MDM Labs ordered and reviewed.   CBC, HCG US OB Transvaginal  Assessment and Plan   1. Normal intrauterine pregnancy on prenatal ultrasound in first trimester   2. [redacted] weeks gestation of pregnancy   3. Vaginal spotting     -Discharge home in stable condition -First trimester precautions discussed -Patient advised to follow-up with OB as scheduled for prenatal care -Patient may return to MAU as needed or if her condition were to change or worsen  Rolm Bookbinder, CNM 12/16/2021, 7:09 PM

## 2021-12-16 NOTE — Discharge Instructions (Signed)

## 2021-12-21 ENCOUNTER — Other Ambulatory Visit: Payer: Commercial Managed Care - HMO

## 2021-12-22 ENCOUNTER — Encounter (HOSPITAL_COMMUNITY): Payer: Self-pay | Admitting: Obstetrics and Gynecology

## 2021-12-22 ENCOUNTER — Inpatient Hospital Stay (HOSPITAL_COMMUNITY)
Admission: AD | Admit: 2021-12-22 | Discharge: 2021-12-22 | Disposition: A | Discharge status: Home / Self Care | Payer: Commercial Managed Care - HMO | Attending: Obstetrics and Gynecology | Admitting: Obstetrics and Gynecology

## 2021-12-22 DIAGNOSIS — O26891 Other specified pregnancy related conditions, first trimester: Secondary | ICD-10-CM | POA: Diagnosis not present

## 2021-12-22 DIAGNOSIS — R110 Nausea: Secondary | ICD-10-CM | POA: Diagnosis not present

## 2021-12-22 DIAGNOSIS — Z3A01 Less than 8 weeks gestation of pregnancy: Secondary | ICD-10-CM | POA: Diagnosis not present

## 2021-12-22 DIAGNOSIS — O99611 Diseases of the digestive system complicating pregnancy, first trimester: Secondary | ICD-10-CM | POA: Insufficient documentation

## 2021-12-22 DIAGNOSIS — K5900 Constipation, unspecified: Secondary | ICD-10-CM | POA: Insufficient documentation

## 2021-12-22 LAB — URINALYSIS, ROUTINE W REFLEX MICROSCOPIC
Bilirubin Urine: NEGATIVE
Glucose, UA: NEGATIVE mg/dL
Hgb urine dipstick: NEGATIVE
Ketones, ur: NEGATIVE mg/dL
Leukocytes,Ua: NEGATIVE
Nitrite: NEGATIVE
Protein, ur: NEGATIVE mg/dL
Specific Gravity, Urine: 1.018 (ref 1.005–1.030)
pH: 7 (ref 5.0–8.0)

## 2021-12-22 MED ORDER — FLEET ENEMA 7-19 GM/118ML RE ENEM
1.0000 | ENEMA | Freq: Once | RECTAL | Status: DC
Start: 2021-12-22 — End: 2021-12-22

## 2021-12-22 MED ORDER — DOXYLAMINE SUCCINATE (SLEEP) 25 MG PO TABS
12.5000 mg | ORAL_TABLET | Freq: Every evening | ORAL | 3 refills | Status: DC | PRN
Start: 2021-12-22 — End: 2022-08-07

## 2021-12-22 MED ORDER — VITAMIN B-6 25 MG PO TABS: 25.0000 mg | ORAL_TABLET | Freq: Three times a day (TID) | ORAL | 1 refills | Status: AC

## 2021-12-22 NOTE — Discharge Instructions (Signed)
-   Take vit b 6 three times a day - Take unisom as needed at night for nausea - if nausea is not well controlled with this regimen, okay to go back to promethazine  -Take 1 capful of MiraLAX in 8 ounces of water, if you do not have a bowel movement for 2 days in a row - continue eating lots of fibre, keeping well hydrated, eating prunes/prune juice Follow up for routine prenatal care

## 2021-12-22 NOTE — MAU Provider Note (Signed)
History     [id number]  Arrival date and time: 12/22/21 0946    Chief Complaint  Patient presents with   Constipation     HPI Kelsey [name] is a 31 y.o. at [redacted]w[redacted]d by LMP who presents for constipation. Her last BM was 4 days ago. None since then. Has had lots of nausea and one episode of vomiting a few days ago. Takes daily phenergan which has been helpful. No prior history of abdominal surgeries. Took miralax, 17g in 8oz of fluid yesterday, has been taking prunes and prune juice and apple juice. Now having some abdominal pain. No vaginal bleeding. Abdominal pain is in epigastric and LUQ.  OB History     Gravida  4   Para  2   Term  2   Preterm      AB  1   Living  2      SAB  1   IAB      Ectopic      Multiple  0   Live Births  2           Past Medical History:  Diagnosis Date   Anemia    Rheumatic fever     Past Surgical History:  Procedure Laterality Date   TONSILLECTOMY      Family History  Problem Relation Age of Onset   Hypertension Mother    Hypertension Father     Social History   Socioeconomic History   Marital status: Married    Spouse name: Not on file   Number of children: Not on file   Years of education: Not on file   Highest education level: Not on file  Occupational History   Not on file  Tobacco Use   Smoking status: Never   Smokeless tobacco: Never  Vaping Use   Vaping Use: Never used  Substance and Sexual Activity   Alcohol use: No   Drug use: No   Sexual activity: Yes  Other Topics Concern   Not on file  Social History Narrative   Not on file   Social Determinants of Health   Financial Resource Strain: Not on file  Food Insecurity: Not on file  Transportation Needs: Not on file  Physical Activity: Not on file  Stress: Not on file  Social Connections: Not on file  Intimate Partner Violence: Not on file    Allergies  Allergen Reactions   No Known Allergies     No current facility-administered  medications on file prior to encounter.   Current Outpatient Medications on File Prior to Encounter  Medication Sig Dispense Refill   folic acid (FOLVITE) 400 MCG tablet Take 400 mcg by mouth daily.     polyethylene glycol (MIRALAX / GLYCOLAX) 17 g packet Take 17 g by mouth daily.     Prenatal Vit-Fe Fumarate-FA (MULTIVITAMIN-PRENATAL) 27-0.8 MG TABS tablet Take 1 tablet by mouth daily at 12 noon.       ROS Pertinent positives and negative per HPI, all others reviewed and negative  Physical Exam   BP 104/69 (BP Location: Left Arm)   Pulse 66   Temp 97.9 F (36.6 C) (Oral)   Resp 16   Ht 5\' 6"  (1.676 m)   Wt 66.2 kg   LMP 11/05/2021   SpO2 100%   BMI 23.57 kg/m   Patient Vitals for the past 24 hrs:  BP Temp Temp src Pulse Resp SpO2 Height Weight  12/22/21 1214 104/69 -- -- 66 16 -- -- --  12/22/21 1009 105/69 97.9 F (36.6 C) Oral 85 16 100 % 5\' 6"  (1.676 m) 66.2 kg    Physical Exam Vitals reviewed.  Constitutional:      General: She is not in acute distress.    Appearance: She is well-developed. She is not toxic-appearing.  HENT:     Head: Normocephalic and atraumatic.     Mouth/Throat:     Mouth: Mucous membranes are moist.  Eyes:     Extraocular Movements: Extraocular movements intact.  Cardiovascular:     Rate and Rhythm: Normal rate.  Pulmonary:     Effort: Pulmonary effort is normal. No respiratory distress.  Abdominal:     General: Abdomen is flat. There is no distension.     Palpations: Abdomen is soft.     Tenderness: There is abdominal tenderness (mild over LUQ and epigastrium).  Skin:    General: Skin is warm and dry.  Neurological:     Mental Status: She is alert and oriented to person, place, and time.  Psychiatric:        Mood and Affect: Mood normal.        Behavior: Behavior normal.    Labs Results for orders placed or performed during the hospital encounter of 12/22/21 (from the past 24 hour(s))  Urinalysis, Routine w reflex microscopic  Urine, Clean Catch     Status: None   Collection Time: 12/22/21 10:16 AM  Result Value Ref Range   Color, Urine YELLOW YELLOW   APPearance CLEAR CLEAR   Specific Gravity, Urine 1.018 1.005 - 1.030   pH 7.0 5.0 - 8.0   Glucose, UA NEGATIVE NEGATIVE mg/dL   Hgb urine dipstick NEGATIVE NEGATIVE   Bilirubin Urine NEGATIVE NEGATIVE   Ketones, ur NEGATIVE NEGATIVE mg/dL   Protein, ur NEGATIVE NEGATIVE mg/dL   Nitrite NEGATIVE NEGATIVE   Leukocytes,Ua NEGATIVE NEGATIVE    Imaging No results found.  MAU Course  Procedures Lab Orders         Urinalysis, Routine w reflex microscopic Urine, Clean Catch     Meds ordered this encounter  Medications   DISCONTD: sodium phosphate (FLEET) 7-19 GM/118ML enema 1 enema   doxylamine, Sleep, (UNISOM) 25 MG tablet    Sig: Take 0.5-1 tablets (12.5-25 mg total) by mouth at bedtime as needed (nausea).    Dispense:  30 tablet    Refill:  3   pyridOXINE (VITAMIN B6) 25 MG tablet    Sig: Take 1 tablet (25 mg total) by mouth every 8 (eight) hours.    Dispense:  90 tablet    Refill:  1   Imaging Orders  No imaging studies ordered today    MDM mild  Assessment and Plan  31 year old female with G4 P2 at 6 weeks and 5 days.  Here with constipation, for the last 4 days.  No significant vomiting, although she has had nausea and is unlikely to be hypokalemic.  Has been taking promethazine daily.  There is a small chance that this may be contributing as constipation is a potential side effect of promethazine  She had soap sud enema followed by a good bowel movement here in MAU. She feels better after this and will be discharged home.  We will hold off promethazine for now, and try vit B6 and unisom for her nausea instead. Continue hydration, high fibre diet; and miralax if no BM for 2 days in a row.  Dispo: discharged to home in stable condition.  Discharge Instructions  Discharge patient   Complete by: As directed    Discharge disposition: 01-Home  or Self Care   Discharge patient date: 12/22/2021       Arias Weinert J Rayme Bui, MD/MPH 12/22/21 1:34 PM  Allergies as of 12/22/2021       Reactions   No Known Allergies         Medication List     STOP taking these medications    promethazine 25 MG tablet Commonly known as: PHENERGAN       TAKE these medications    doxylamine (Sleep) 25 MG tablet Commonly known as: UNISOM Take 0.5-1 tablets (12.5-25 mg total) by mouth at bedtime as needed (nausea).   folic acid 400 MCG tablet Commonly known as: FOLVITE Take 400 mcg by mouth daily.   multivitamin-prenatal 27-0.8 MG Tabs tablet Take 1 tablet by mouth daily at 12 noon.   polyethylene glycol 17 g packet Commonly known as: MIRALAX / GLYCOLAX Take 17 g by mouth daily.   pyridOXINE 25 MG tablet Commonly known as: VITAMIN B6 Take 1 tablet (25 mg total) by mouth every 8 (eight) hours.       Sheppard Evens MD MPH OB Fellow, Faculty Practice [facility], [facility] 12/22/2021

## 2021-12-22 NOTE — MAU Note (Signed)
Kelsey [name] is a 31 y.o. at [redacted]w[redacted]d here in MAU reporting: hasn't had a BM in 4 days.  Called office yesterday, was told Miralax, warm prune juice and apple juice.  Took the prescribed amt of Miralax, and drank 16oz of prune juice, no results.  Having pain in upper abd and LLQ. Started 4 days ago. Upper abd Hurts when she eats. Onset of complaint: 4days ago Pain score: 7 Vitals:   12/22/21 1009  BP: 105/69  Pulse: 85  Resp: 16  Temp: 97.9 F (36.6 C)  SpO2: 100%      Lab orders placed from triage:  urine

## 2021-12-28 ENCOUNTER — Other Ambulatory Visit: Payer: Commercial Managed Care - HMO

## 2021-12-29 LAB — OB RESULTS CONSOLE HEPATITIS B SURFACE ANTIGEN: Hepatitis B Surface Ag: NEGATIVE

## 2021-12-29 LAB — HEPATITIS C ANTIBODY: HCV Ab: NEGATIVE

## 2021-12-29 LAB — OB RESULTS CONSOLE RUBELLA ANTIBODY, IGM: Rubella: IMMUNE

## 2021-12-29 LAB — OB RESULTS CONSOLE HIV ANTIBODY (ROUTINE TESTING): HIV: NONREACTIVE

## 2022-01-01 ENCOUNTER — Other Ambulatory Visit: Payer: Self-pay

## 2022-02-04 ENCOUNTER — Other Ambulatory Visit (INDEPENDENT_AMBULATORY_CARE_PROVIDER_SITE_OTHER): Payer: Commercial Managed Care - HMO

## 2022-02-04 ENCOUNTER — Ambulatory Visit (INDEPENDENT_AMBULATORY_CARE_PROVIDER_SITE_OTHER): Payer: Commercial Managed Care - HMO | Admitting: Cardiology

## 2022-02-04 ENCOUNTER — Encounter: Payer: Self-pay | Admitting: Cardiology

## 2022-02-04 VITALS — BP 100/82 | HR 97 | Ht 66.0 in | Wt 149.4 lb

## 2022-02-04 DIAGNOSIS — R002 Palpitations: Secondary | ICD-10-CM | POA: Diagnosis not present

## 2022-02-04 DIAGNOSIS — R0609 Other forms of dyspnea: Secondary | ICD-10-CM | POA: Diagnosis not present

## 2022-02-04 DIAGNOSIS — R0602 Shortness of breath: Secondary | ICD-10-CM

## 2022-02-04 DIAGNOSIS — Z8679 Personal history of other diseases of the circulatory system: Secondary | ICD-10-CM

## 2022-02-04 NOTE — Progress Notes (Signed)
Cardio-Obstetrics Clinic  New Evaluation  Date:  02/04/2022   ID:  Kelsey [name], DOB 1990/12/11, MRN [id number]  PCP:  Patient, No Pcp Per   [facility] Providers Cardiologist:  Thomasene Ripple, DO  Electrophysiologist:  None       Referring MD: Mittie Bodo, NP   Chief Complaint: " I am short of breath"  History of Present Illness:    Kelsey [name] is a 31 y.o. female [G4P2012] who is being seen today for the evaluation of  at the request of Hands, Kylie, NP.   She has a hx of Rheumatic fever as a child presents to be evaluated for shortness of  breath and palpiations.  She tells me that she has been experiencing worsening shortness of breath on exertion.   Prior CV Studies Reviewed: The following studies were reviewed today: None   Past Medical History:  Diagnosis Date   Anemia    Rheumatic fever     Past Surgical History:  Procedure Laterality Date   TONSILLECTOMY        OB History     Gravida  4   Para  2   Term  2   Preterm      AB  1   Living  2      SAB  1   IAB      Ectopic      Multiple  0   Live Births  2               Current Medications: Current Meds  Medication Sig   Cholecalciferol 50 MCG (2000 UT) CAPS Take 2 capsules every day by oral route for 30 days.   doxylamine, Sleep, (UNISOM) 25 MG tablet Take 0.5-1 tablets (12.5-25 mg total) by mouth at bedtime as needed (nausea).   folic acid (FOLVITE) 400 MCG tablet Take 400 mcg by mouth daily.   Prenatal Vit-Fe Fumarate-FA (MULTIVITAMIN-PRENATAL) 27-0.8 MG TABS tablet Take 1 tablet by mouth daily at 12 noon.   pyridOXINE (VITAMIN B6) 25 MG tablet Take 1 tablet (25 mg total) by mouth every 8 (eight) hours.     Allergies:   No known allergies   Social History   Socioeconomic History   Marital status: Married    Spouse name: Not on file   Number of children: Not on file   Years of education: Not on file   Highest education level: Not on file  Occupational History    Not on file  Tobacco Use   Smoking status: Never   Smokeless tobacco: Never  Vaping Use   Vaping Use: Never used  Substance and Sexual Activity   Alcohol use: No   Drug use: No   Sexual activity: Yes  Other Topics Concern   Not on file  Social History Narrative   Not on file   Social Determinants of Health   Financial Resource Strain: Not on file  Food Insecurity: Not on file  Transportation Needs: Not on file  Physical Activity: Not on file  Stress: Not on file  Social Connections: Not on file      Family History  Problem Relation Age of Onset   Hypertension Mother    Hypertension Father       ROS:   Please see the history of present illness.    Reports shortness of breath All other systems reviewed and are negative.   Labs/EKG Reviewed:    EKG:   EKG is was not ordered today.  Recent Labs: 12/16/2021: Hemoglobin 12.1; Platelets 295   Recent Lipid Panel No results found for: "CHOL", "TRIG", "HDL", "CHOLHDL", "LDLCALC", "LDLDIRECT"  Physical Exam:    VS:  BP 100/82   Pulse 97   Ht 5\' 6"  (1.676 m)   Wt 149 lb 6.4 oz (67.8 kg)   LMP 11/05/2021   SpO2 100%   BMI 24.11 kg/m     Wt Readings from Last 3 Encounters:  02/04/22 149 lb 6.4 oz (67.8 kg)  12/22/21 146 lb (66.2 kg)  12/16/21 148 lb 3.2 oz (67.2 kg)     GEN:  Well nourished, well developed in no acute distress HEENT: Normal NECK: No JVD; No carotid bruits LYMPHATICS: No lymphadenopathy CARDIAC: RRR, 2/6 murmurs, rubs, gallops RESPIRATORY:  Clear to auscultation without rales, wheezing or rhonchi  ABDOMEN: Soft, non-tender, non-distended MUSCULOSKELETAL:  No edema; No deformity  SKIN: Warm and dry NEUROLOGIC:  Alert and oriented x 3 PSYCHIATRIC:  Normal affect    Risk Assessment/Risk Calculators:     CARPREG II Risk Prediction Index Score:  1.  The patient's risk for a primary cardiac event is 5%.            ASSESSMENT & PLAN:    Palpitations Dyspnea on exertion  Hx of  Rheumatic fever   Will get echo to assess structural abnormalities. Palpations is out of proportion for her pregnant state will place monitor on the patient to rule out arrhythmias   Patient Instructions  Medication Instructions:  Your physician recommends that you continue on your current medications as directed. Please refer to the Current Medication list given to you today.  *If you need a refill on your cardiac medications before your next appointment, please call your pharmacy*   Lab Work: NONE If you have labs (blood work) drawn today and your tests are completely normal, you will receive your results only by: MyChart Message (if you have MyChart) OR A paper copy in the mail If you have any lab test that is abnormal or we need to change your treatment, we will call you to review the results.   Testing/Procedures: Your physician has requested that you have an echocardiogram. Echocardiography is a painless test that uses sound waves to create images of your heart. It provides your doctor with information about the size and shape of your heart and how well your heart's chambers and valves are working. This procedure takes approximately one hour. There are no restrictions for this procedure. Please do NOT wear cologne, perfume, aftershave, or lotions (deodorant is allowed). Please arrive 15 minutes prior to your appointment time.   ZIO XT- Long Term Monitor Instructions  Your physician has requested you wear a ZIO patch monitor for 7 days.  This is a single patch monitor. [employer] supplies one patch monitor per enrollment. Additional stickers are not available. Please do not apply patch if you will be having a Nuclear Stress Test,  Echocardiogram, Cardiac CT, MRI, or Chest Xray during the period you would be wearing the  monitor. The patch cannot be worn during these tests. You cannot remove and re-apply the  ZIO XT patch monitor.  Your ZIO patch monitor will be mailed 3 day USPS to  your address on file. It may take 3-5 days  to receive your monitor after you have been enrolled.  Once you have received your monitor, please review the enclosed instructions. Your monitor  has already been registered assigning a specific monitor serial # to you.  Billing and  Patient Assistance Program Information  We have supplied [employer] with any of your insurance information on file for billing purposes. [employer] offers a sliding scale Patient Assistance Program for patients that do not have  insurance, or whose insurance does not completely cover the cost of the ZIO monitor.  You must apply for the Patient Assistance Program to qualify for this discounted rate.  To apply, please call [employer] at [phone], select option 4, select option 2, ask to apply for  Patient Assistance Program. Theodore Demark will ask your household income, and how many people  are in your household. They will quote your out-of-pocket cost based on that information.  [employer] will also be able to set up a 56-month, interest-free payment plan if needed.  Applying the monitor   Shave hair from upper left chest.  Hold abrader disc by orange tab. Rub abrader in 40 strokes over the upper left chest as  indicated in your monitor instructions.  Clean area with 4 enclosed alcohol pads. Let dry.  Apply patch as indicated in monitor instructions. Patch will be placed under collarbone on left  side of chest with arrow pointing upward.  Rub patch adhesive wings for 2 minutes. Remove white label marked "1". Remove the white  label marked "2". Rub patch adhesive wings for 2 additional minutes.  While looking in a mirror, press and release button in center of patch. A small green light will  flash 3-4 times. This will be your only indicator that the monitor has been turned on.  Do not shower for the first 24 hours. You may shower after the first 24 hours.  Press the button if you feel a symptom. You will hear a small click. Record  Date, Time and  Symptom in the Patient Logbook.  When you are ready to remove the patch, follow instructions on the last 2 pages of Patient  Logbook. Stick patch monitor onto the last page of Patient Logbook.  Place Patient Logbook in the blue and white box. Use locking tab on box and tape box closed  securely. The blue and white box has prepaid postage on it. Please place it in the mailbox as  soon as possible. Your physician should have your test results approximately 7 days after the  monitor has been mailed back to [facility].  Call [place] at [phone] if you have questions regarding  your ZIO XT patch monitor. Call them immediately if you see an orange light blinking on your  monitor.  If your monitor falls off in less than 4 days, contact our Monitor department at [phone].  If your monitor becomes loose or falls off after 4 days call [employer] at [phone] for  suggestions on securing your monitor    Follow-Up: At [facility], you and your health needs are our priority.  As part of our continuing mission to provide you with exceptional heart care, we have created designated Provider Care Teams.  These Care Teams include your primary Cardiologist (physician) and Advanced Practice Providers (APPs -  Physician Assistants and Nurse Practitioners) who all work together to provide you with the care you need, when you need it.  We recommend signing up for the patient portal called "MyChart".  Sign up information is provided on this After Visit Summary.  MyChart is used to connect with patients for Virtual Visits (Telemedicine).  Patients are able to view lab/test results, encounter notes, upcoming appointments, etc.  Non-urgent messages can be sent to your provider as  well.   To learn more about what you can do with MyChart, go to [email or web address].    Your next appointment:   12 week(s)  The format for your next appointment:   In  Person  Provider:   Thomasene Ripple  [facility] Women [address], [place], [place] [address]    Dispo:  Return in about 12 weeks (around 04/29/2022).   Medication Adjustments/Labs and Tests Ordered: Current medicines are reviewed at length with the patient today.  Concerns regarding medicines are outlined above.  Tests Ordered: Orders Placed This Encounter  Procedures   LONG TERM MONITOR (3-14 DAYS)   EKG 12-Lead   ECHOCARDIOGRAM COMPLETE   Medication Changes: No orders of the defined types were placed in this encounter.

## 2022-02-04 NOTE — Patient Instructions (Addendum)
Medication Instructions:  Your physician recommends that you continue on your current medications as directed. Please refer to the Current Medication list given to you today.  *If you need a refill on your cardiac medications before your next appointment, please call your pharmacy*   Lab Work: NONE If you have labs (blood work) drawn today and your tests are completely normal, you will receive your results only by: MyChart Message (if you have MyChart) OR A paper copy in the mail If you have any lab test that is abnormal or we need to change your treatment, we will call you to review the results.   Testing/Procedures: Your physician has requested that you have an echocardiogram. Echocardiography is a painless test that uses sound waves to create images of your heart. It provides your doctor with information about the size and shape of your heart and how well your heart's chambers and valves are working. This procedure takes approximately one hour. There are no restrictions for this procedure. Please do NOT wear cologne, perfume, aftershave, or lotions (deodorant is allowed). Please arrive 15 minutes prior to your appointment time.   ZIO XT- Long Term Monitor Instructions  Your physician has requested you wear a ZIO patch monitor for 7 days.  This is a single patch monitor. [employer] supplies one patch monitor per enrollment. Additional stickers are not available. Please do not apply patch if you will be having a Nuclear Stress Test,  Echocardiogram, Cardiac CT, MRI, or Chest Xray during the period you would be wearing the  monitor. The patch cannot be worn during these tests. You cannot remove and re-apply the  ZIO XT patch monitor.  Your ZIO patch monitor will be mailed 3 day USPS to your address on file. It may take 3-5 days  to receive your monitor after you have been enrolled.  Once you have received your monitor, please review the enclosed instructions. Your monitor  has already been  registered assigning a specific monitor serial # to you.  Billing and Patient Assistance Program Information  We have supplied [employer] with any of your insurance information on file for billing purposes. [employer] offers a sliding scale Patient Assistance Program for patients that do not have  insurance, or whose insurance does not completely cover the cost of the ZIO monitor.  You must apply for the Patient Assistance Program to qualify for this discounted rate.  To apply, please call [employer] at [phone], select option 4, select option 2, ask to apply for  Patient Assistance Program. Meredeth Ide will ask your household income, and how many people  are in your household. They will quote your out-of-pocket cost based on that information.  [employer] will also be able to set up a 34-month, interest-free payment plan if needed.  Applying the monitor   Shave hair from upper left chest.  Hold abrader disc by orange tab. Rub abrader in 40 strokes over the upper left chest as  indicated in your monitor instructions.  Clean area with 4 enclosed alcohol pads. Let dry.  Apply patch as indicated in monitor instructions. Patch will be placed under collarbone on left  side of chest with arrow pointing upward.  Rub patch adhesive wings for 2 minutes. Remove white label marked "1". Remove the white  label marked "2". Rub patch adhesive wings for 2 additional minutes.  While looking in a mirror, press and release button in center of patch. A small green light will  flash 3-4 times. This will be your only indicator  that the monitor has been turned on.  Do not shower for the first 24 hours. You may shower after the first 24 hours.  Press the button if you feel a symptom. You will hear a small click. Record Date, Time and  Symptom in the Patient Logbook.  When you are ready to remove the patch, follow instructions on the last 2 pages of Patient  Logbook. Stick patch monitor onto the last page of Patient  Logbook.  Place Patient Logbook in the blue and white box. Use locking tab on box and tape box closed  securely. The blue and white box has prepaid postage on it. Please place it in the mailbox as  soon as possible. Your physician should have your test results approximately 7 days after the  monitor has been mailed back to [facility].  Call [place] at [phone] if you have questions regarding  your ZIO XT patch monitor. Call them immediately if you see an orange light blinking on your  monitor.  If your monitor falls off in less than 4 days, contact our Monitor department at [phone].  If your monitor becomes loose or falls off after 4 days call [employer] at [phone] for  suggestions on securing your monitor    Follow-Up: At [facility], you and your health needs are our priority.  As part of our continuing mission to provide you with exceptional heart care, we have created designated Provider Care Teams.  These Care Teams include your primary Cardiologist (physician) and Advanced Practice Providers (APPs -  Physician Assistants and Nurse Practitioners) who all work together to provide you with the care you need, when you need it.  We recommend signing up for the patient portal called "MyChart".  Sign up information is provided on this After Visit Summary.  MyChart is used to connect with patients for Virtual Visits (Telemedicine).  Patients are able to view lab/test results, encounter notes, upcoming appointments, etc.  Non-urgent messages can be sent to your provider as well.   To learn more about what you can do with MyChart, go to [email or web address].    Your next appointment:   12 week(s)  The format for your next appointment:   In Person  Provider:   Berniece Salines  [facility] Women [address] [place], [place] [address]

## 2022-02-22 ENCOUNTER — Ambulatory Visit (HOSPITAL_COMMUNITY): Payer: Commercial Managed Care - HMO | Attending: Cardiology

## 2022-02-22 DIAGNOSIS — R0602 Shortness of breath: Secondary | ICD-10-CM | POA: Diagnosis present

## 2022-02-22 LAB — ECHOCARDIOGRAM COMPLETE
Area-P 1/2: 3.98 cm2
S' Lateral: 2.6 cm

## 2022-02-28 ENCOUNTER — Other Ambulatory Visit: Payer: Self-pay | Admitting: Obstetrics and Gynecology

## 2022-02-28 DIAGNOSIS — Z363 Encounter for antenatal screening for malformations: Secondary | ICD-10-CM

## 2022-03-24 ENCOUNTER — Ambulatory Visit: Payer: Commercial Managed Care - HMO | Admitting: *Deleted

## 2022-03-24 ENCOUNTER — Other Ambulatory Visit: Payer: Self-pay | Admitting: *Deleted

## 2022-03-24 ENCOUNTER — Ambulatory Visit: Payer: Commercial Managed Care - HMO | Attending: Obstetrics and Gynecology

## 2022-03-24 VITALS — BP 106/66 | HR 99

## 2022-03-24 DIAGNOSIS — Z3689 Encounter for other specified antenatal screening: Secondary | ICD-10-CM | POA: Insufficient documentation

## 2022-03-24 DIAGNOSIS — Z363 Encounter for antenatal screening for malformations: Secondary | ICD-10-CM | POA: Diagnosis not present

## 2022-03-24 DIAGNOSIS — Z362 Encounter for other antenatal screening follow-up: Secondary | ICD-10-CM

## 2022-04-18 NOTE — L&D Delivery Note (Signed)
Delivery Note Labor onset: 08/05/2022  Labor Onset Time: 2100 Complete dilation at 6:25 AM  Onset of pushing at 0630 FHR second stage Cat 1 Analgesia/Anesthesia intrapartum: epidural  Guided pushing with maternal urge. Delivery of a viable female at [phone]. Fetal head delivered in LOA position.  Nuchal cord: none.  Infant placed on maternal abd, dried, and tactile stim.  Cord double clamped after 3 min and cut by Father.  RN x2 present for birth.  Cord blood sample collected: Yes Arterial cord blood sample collected: No  Placenta delivered Tomasa Blase, intact, with 3 VC.  Placenta to L&D. Uterine tone firm, bleeding scant  No laceration identified.  QBL/EBL (mL): 201 Complications: none APGAR: APGAR (1 MIN):   APGAR (5 MINS):   APGAR (10 MINS):   Mom to postpartum.  Baby to Couplet care / Skin to Skin.   Roma Schanz DNP, CNM 08/06/2022, 6:56 AM

## 2022-04-28 ENCOUNTER — Ambulatory Visit: Payer: Commercial Managed Care - HMO

## 2022-05-06 ENCOUNTER — Encounter: Payer: Self-pay | Admitting: Cardiology

## 2022-05-06 ENCOUNTER — Ambulatory Visit (INDEPENDENT_AMBULATORY_CARE_PROVIDER_SITE_OTHER): Payer: Commercial Managed Care - HMO | Admitting: Cardiology

## 2022-05-06 VITALS — BP 112/82 | HR 110 | Ht 66.0 in | Wt 157.7 lb

## 2022-05-06 DIAGNOSIS — R002 Palpitations: Secondary | ICD-10-CM | POA: Diagnosis not present

## 2022-05-06 MED ORDER — PROPRANOLOL HCL 10 MG PO TABS: 10.0000 mg | ORAL_TABLET | Freq: Every evening | ORAL | 3 refills | Status: DC

## 2022-05-06 NOTE — Patient Instructions (Signed)
Medication Instructions:  Your physician has recommended you make the following change in your medication:  START: Propranolol 10 mg nightly *If you need a refill on your cardiac medications before your next appointment, please call your pharmacy*   Lab Work: None   Testing/Procedures: None   Follow-Up: At [facility], you and your health needs are our priority.  As part of our continuing mission to provide you with exceptional heart care, we have created designated Provider Care Teams.  These Care Teams include your primary Cardiologist (physician) and Advanced Practice Providers (APPs -  Physician Assistants and Nurse Practitioners) who all work together to provide you with the care you need, when you need it.  We recommend signing up for the patient portal called "MyChart".  Sign up information is provided on this After Visit Summary.  MyChart is used to connect with patients for Virtual Visits (Telemedicine).  Patients are able to view lab/test results, encounter notes, upcoming appointments, etc.  Non-urgent messages can be sent to your provider as well.   To learn more about what you can do with MyChart, go to [email or web address].    Your next appointment:   6 week(s)  Provider:   Berniece Salines  [facility] Women [address] [place], [place] [address]   Other Instructions

## 2022-05-06 NOTE — Progress Notes (Signed)
Cardio-Obstetrics Clinic  New Evaluation  Date:  05/07/2022   ID:  Kelsey [name], DOB November 08, 1990, MRN [id number]  PCP:  Patient, No Pcp Per   [facility] Providers Cardiologist:  Berniece Salines, DO  Electrophysiologist:  None       Referring MD: No ref. provider found   Chief Complaint: " I am short of breath"  History of Present Illness:    Kelsey [name] is a 32 y.o. female [G4P2012] who is being seen today for the evaluation of  at the request of No ref. provider found.   She has a hx of Rheumatic fever as a child presents to be evaluated for shortness of  breath and palpiations.  She tells me that she has been experiencing worsening shortness of breath on exertion.   Prior CV Studies Reviewed: The following studies were reviewed today: None   Past Medical History:  Diagnosis Date   Anemia    Rheumatic fever     Past Surgical History:  Procedure Laterality Date   TONSILLECTOMY        OB History     Gravida  4   Para  2   Term  2   Preterm      AB  1   Living  2      SAB  1   IAB      Ectopic      Multiple  0   Live Births  2               Current Medications: Current Meds  Medication Sig   Cholecalciferol 50 MCG (2000 UT) CAPS    doxylamine, Sleep, (UNISOM) 25 MG tablet Take 0.5-1 tablets (12.5-25 mg total) by mouth at bedtime as needed (nausea).   Prenatal Vit-Fe Fumarate-FA (MULTIVITAMIN-PRENATAL) 27-0.8 MG TABS tablet Take 1 tablet by mouth daily at 12 noon.   propranolol (INDERAL) 10 MG tablet Take 1 tablet (10 mg total) by mouth at bedtime.   Pyridoxine HCl (VITAMIN B-6 PO) Take by mouth.     Allergies:   No known allergies   Social History   Socioeconomic History   Marital status: Married    Spouse name: Not on file   Number of children: Not on file   Years of education: Not on file   Highest education level: Not on file  Occupational History   Not on file  Tobacco Use   Smoking status: Never   Smokeless  tobacco: Never  Vaping Use   Vaping Use: Never used  Substance and Sexual Activity   Alcohol use: No   Drug use: No   Sexual activity: Yes  Other Topics Concern   Not on file  Social History Narrative   Not on file   Social Determinants of Health   Financial Resource Strain: Not on file  Food Insecurity: Not on file  Transportation Needs: Not on file  Physical Activity: Not on file  Stress: Not on file  Social Connections: Not on file      Family History  Problem Relation Age of Onset   Hypertension Mother    Hypertension Father       ROS:   Please see the history of present illness.    Reports shortness of breath All other systems reviewed and are negative.   Labs/EKG Reviewed:    EKG:   EKG is was not ordered today.    Recent Labs: 12/16/2021: Hemoglobin 12.1; Platelets 295   Recent Lipid Panel No results  found for: "CHOL", "TRIG", "HDL", "CHOLHDL", "LDLCALC", "LDLDIRECT"  Physical Exam:    VS:  BP 112/82   Pulse (!) 110   Ht 5\' 6"  (1.676 m)   Wt 71.5 kg   LMP 11/05/2021   SpO2 99%   BMI 25.45 kg/m     Wt Readings from Last 3 Encounters:  05/06/22 71.5 kg  02/04/22 67.8 kg  12/22/21 66.2 kg     GEN:  Well nourished, well developed in no acute distress HEENT: Normal NECK: No JVD; No carotid bruits LYMPHATICS: No lymphadenopathy CARDIAC: RRR, 2/6 murmurs, rubs, gallops RESPIRATORY:  Clear to auscultation without rales, wheezing or rhonchi  ABDOMEN: Soft, non-tender, non-distended MUSCULOSKELETAL:  No edema; No deformity  SKIN: Warm and dry NEUROLOGIC:  Alert and oriented x 3 PSYCHIATRIC:  Normal affect    Risk Assessment/Risk Calculators:                  ASSESSMENT & PLAN:    Palpitations Dyspnea on exertion  Hx of Rheumatic fever   She is still experiencing palpitations. This is causing her to be very uncomfortable and leading to symptoms of lightheadedness and dizziness with this occurs. I will start propanolol 10 mg at  night.  Echo normal, no arrhythmia on the monitor.   Patient Instructions  Medication Instructions:  Your physician has recommended you make the following change in your medication:  START: Propranolol 10 mg nightly *If you need a refill on your cardiac medications before your next appointment, please call your pharmacy*   Lab Work: None   Testing/Procedures: None   Follow-Up: At [facility], you and your health needs are our priority.  As part of our continuing mission to provide you with exceptional heart care, we have created designated Provider Care Teams.  These Care Teams include your primary Cardiologist (physician) and Advanced Practice Providers (APPs -  Physician Assistants and Nurse Practitioners) who all work together to provide you with the care you need, when you need it.  We recommend signing up for the patient portal called "MyChart".  Sign up information is provided on this After Visit Summary.  MyChart is used to connect with patients for Virtual Visits (Telemedicine).  Patients are able to view lab/test results, encounter notes, upcoming appointments, etc.  Non-urgent messages can be sent to your provider as well.   To learn more about what you can do with MyChart, go to [email or web address].    Your next appointment:   6 week(s)  Provider:   Berniece Salines  [facility] Women [address], [place], [place] [address]   Other Instructions     Dispo:  No follow-ups on file.   Medication Adjustments/Labs and Tests Ordered: Current medicines are reviewed at length with the patient today.  Concerns regarding medicines are outlined above.  Tests Ordered: No orders of the defined types were placed in this encounter.  Medication Changes: Meds ordered this encounter  Medications   propranolol (INDERAL) 10 MG tablet    Sig: Take 1 tablet (10 mg total) by mouth at bedtime.    Dispense:  90 tablet    Refill:  3

## 2022-06-02 ENCOUNTER — Encounter (HOSPITAL_COMMUNITY): Payer: Self-pay | Admitting: Obstetrics and Gynecology

## 2022-06-02 ENCOUNTER — Inpatient Hospital Stay (HOSPITAL_COMMUNITY)
Admission: AD | Admit: 2022-06-02 | Discharge: 2022-06-02 | Disposition: A | Discharge status: Home / Self Care | Payer: Commercial Managed Care - HMO | Attending: Obstetrics and Gynecology | Admitting: Obstetrics and Gynecology

## 2022-06-02 DIAGNOSIS — R110 Nausea: Secondary | ICD-10-CM

## 2022-06-02 DIAGNOSIS — J069 Acute upper respiratory infection, unspecified: Secondary | ICD-10-CM | POA: Insufficient documentation

## 2022-06-02 DIAGNOSIS — O212 Late vomiting of pregnancy: Secondary | ICD-10-CM | POA: Insufficient documentation

## 2022-06-02 DIAGNOSIS — Z1152 Encounter for screening for COVID-19: Secondary | ICD-10-CM | POA: Diagnosis not present

## 2022-06-02 DIAGNOSIS — Z3A29 29 weeks gestation of pregnancy: Secondary | ICD-10-CM | POA: Diagnosis not present

## 2022-06-02 LAB — URINALYSIS, ROUTINE W REFLEX MICROSCOPIC
Bilirubin Urine: NEGATIVE
Glucose, UA: NEGATIVE mg/dL
Hgb urine dipstick: NEGATIVE
Ketones, ur: NEGATIVE mg/dL
Nitrite: NEGATIVE
Protein, ur: NEGATIVE mg/dL
Specific Gravity, Urine: 1.008 (ref 1.005–1.030)
pH: 7 (ref 5.0–8.0)

## 2022-06-02 LAB — RESP PANEL BY RT-PCR (RSV, FLU A&B, COVID)  RVPGX2
Influenza A by PCR: NEGATIVE
Influenza B by PCR: NEGATIVE
Resp Syncytial Virus by PCR: NEGATIVE
SARS Coronavirus 2 by RT PCR: NEGATIVE

## 2022-06-02 LAB — GROUP A STREP BY PCR: Group A Strep by PCR: NOT DETECTED

## 2022-06-02 MED ORDER — ONDANSETRON HCL 4 MG PO TABS: 4.0000 mg | ORAL_TABLET | Freq: Three times a day (TID) | ORAL | 0 refills | Status: DC | PRN

## 2022-06-02 MED ORDER — ACETAMINOPHEN 500 MG PO TABS
1000.0000 mg | ORAL_TABLET | Freq: Four times a day (QID) | ORAL | Status: DC | PRN
Start: 2022-06-02 — End: 2022-06-02
  Administered 2022-06-02: 1000 mg via ORAL
  Filled 2022-06-02: qty 2

## 2022-06-02 NOTE — MAU Note (Addendum)
.  Kelsey [name] is a 32 y.o. at [redacted]w[redacted]d here in MAU reporting: pt has had fever 102 for 2 days. C/o chills and body aches. No respiratory symptoms mild sore throat today. Has taken tylenol but has not helped.  Reports decreased fetal movement.for the past few days. Pt has not been eating or drinking much since she has started to feel symptoms.  LMP:  Onset of complaint: 2 days. Pain score: 6 Vitals:   06/02/22 1330  BP: 110/73  Pulse: (!) 114  Resp: 18  Temp: (!) 97.4 F (36.3 C)     FHT:145 Lab orders placed from triage:  U/A, Resp Pannel

## 2022-06-02 NOTE — MAU Provider Note (Addendum)
History     CSN: [id number]  Arrival date and time: 06/02/22 1313   Event Date/Time   First Provider Initiated Contact with Patient 06/02/22 1405      Chief Complaint  Patient presents with   Fever   32 y.o. [id number] @29$ .6 wks presenting with fever, chills, body aches, and sore throat. Sx started 2 days ago. Denies sick contacts. Denies cough. She took Tylenol around 8am but it didn't help the body aches. Reports 1 episode of vomiting 2 days ago and no appetite since. Reports less FM over the last 2 days but feeling good FM now.    OB History     Gravida  4   Para  2   Term  2   Preterm      AB  1   Living  2      SAB  1   IAB      Ectopic      Multiple  0   Live Births  2           Past Medical History:  Diagnosis Date   Anemia    Rheumatic fever     Past Surgical History:  Procedure Laterality Date   TONSILLECTOMY      Family History  Problem Relation Age of Onset   Hypertension Mother    Hypertension Father     Social History   Tobacco Use   Smoking status: Never   Smokeless tobacco: Never  Vaping Use   Vaping Use: Never used  Substance Use Topics   Alcohol use: No   Drug use: No    Allergies:  Allergies  Allergen Reactions   No Known Allergies     No medications prior to admission.    Review of Systems  Constitutional:  Positive for appetite change, chills and fever.  HENT:  Positive for congestion (nasal) and sore throat.   Respiratory:  Positive for chest tightness. Negative for cough and shortness of breath.   Gastrointestinal:  Positive for nausea and vomiting. Negative for abdominal pain.  Genitourinary:  Negative for vaginal bleeding.  Musculoskeletal:  Positive for myalgias.   Physical Exam   Blood pressure 110/73, pulse (!) 114, temperature (!) 97.2 F (36.2 C), temperature source Oral, resp. rate 18, height 5' 6"$  (1.676 m), weight 71.7 kg, last menstrual period 11/05/2021, SpO2 100 %, unknown if currently  breastfeeding.  Physical Exam Vitals and nursing note reviewed.  Constitutional:      General: She is not in acute distress.    Appearance: Normal appearance.  HENT:     Head: Normocephalic and atraumatic.     Mouth/Throat:     Lips: Pink.     Mouth: Mucous membranes are moist.     Palate: No lesions.     Pharynx: Oropharynx is clear. Uvula midline. No pharyngeal swelling, oropharyngeal exudate, posterior oropharyngeal erythema or uvula swelling.     Tonsils: No tonsillar abscesses.  Cardiovascular:     Rate and Rhythm: Regular rhythm. Tachycardia present.     Heart sounds: Normal heart sounds.  Pulmonary:     Effort: Pulmonary effort is normal. No respiratory distress.     Breath sounds: Normal breath sounds. No stridor. No wheezing, rhonchi or rales.  Musculoskeletal:        General: Normal range of motion.     Cervical back: Normal range of motion.  Skin:    General: Skin is warm and dry.  Neurological:     General: No  focal deficit present.     Mental Status: She is alert and oriented to person, place, and time.  Psychiatric:        Mood and Affect: Mood normal.        Behavior: Behavior normal.   EFM: 140 bpm, mod variability, + accels, no decels Toco: none  Results for orders placed or performed during the hospital encounter of 06/02/22 (from the past 24 hour(s))  Resp panel by RT-PCR (RSV, Flu A&B, Covid) Anterior Nasal Swab     Status: None   Collection Time: 06/02/22  1:32 PM   Specimen: Anterior Nasal Swab  Result Value Ref Range   SARS Coronavirus 2 by RT PCR NEGATIVE NEGATIVE   Influenza A by PCR NEGATIVE NEGATIVE   Influenza B by PCR NEGATIVE NEGATIVE   Resp Syncytial Virus by PCR NEGATIVE NEGATIVE  Urinalysis, Routine w reflex microscopic -Urine, Clean Catch     Status: Abnormal   Collection Time: 06/02/22  1:53 PM  Result Value Ref Range   Color, Urine YELLOW YELLOW   APPearance HAZY (A) CLEAR   Specific Gravity, Urine 1.008 1.005 - 1.030   pH 7.0 5.0  - 8.0   Glucose, UA NEGATIVE NEGATIVE mg/dL   Hgb urine dipstick NEGATIVE NEGATIVE   Bilirubin Urine NEGATIVE NEGATIVE   Ketones, ur NEGATIVE NEGATIVE mg/dL   Protein, ur NEGATIVE NEGATIVE mg/dL   Nitrite NEGATIVE NEGATIVE   Leukocytes,Ua MODERATE (A) NEGATIVE   RBC / HPF 0-5 0 - 5 RBC/hpf   WBC, UA 6-10 0 - 5 WBC/hpf   Bacteria, UA MANY (A) NONE SEEN   Squamous Epithelial / HPF 6-10 0 - 5 /HPF  Group A Strep by PCR     Status: None   Collection Time: 06/02/22  4:10 PM   Specimen: Throat; Sterile Swab  Result Value Ref Range   Group A Strep by PCR NOT DETECTED NOT DETECTED   MAU Course  Procedures  MDM Labs ordered and reviewed. Suspect viral URI. Discussed support measures. UA sent for culture. Stable for discharge home.   Assessment and Plan   1. [redacted] weeks gestation of pregnancy   2. Upper respiratory virus   3. Nausea    Discharge home Follow up at [facility] as scheduled Rx Zofran Return precautions  Allergies as of 06/02/2022       Reactions   No Known Allergies         Medication List     TAKE these medications    acetaminophen 500 MG tablet Commonly known as: TYLENOL Take 500 mg by mouth every 6 (six) hours as needed for fever, headache or mild pain.   Cholecalciferol 50 MCG (2000 UT) Caps   doxylamine (Sleep) 25 MG tablet Commonly known as: UNISOM Take 0.5-1 tablets (12.5-25 mg total) by mouth at bedtime as needed (nausea).   multivitamin-prenatal 27-0.8 MG Tabs tablet Take 1 tablet by mouth daily at 12 noon.   ondansetron 4 MG tablet Commonly known as: Zofran Take 1 tablet (4 mg total) by mouth every 8 (eight) hours as needed for nausea or vomiting.   propranolol 10 MG tablet Commonly known as: INDERAL Take 1 tablet (10 mg total) by mouth at bedtime.   VITAMIN B-6 PO Take by mouth.        Julianne Handler, CNM 06/02/2022, 5:58 PM

## 2022-06-03 LAB — CULTURE, OB URINE: Culture: <10000 — AB

## 2022-06-05 ENCOUNTER — Other Ambulatory Visit: Payer: Self-pay

## 2022-06-05 ENCOUNTER — Inpatient Hospital Stay (HOSPITAL_COMMUNITY): Payer: Commercial Managed Care - HMO

## 2022-06-05 ENCOUNTER — Inpatient Hospital Stay (HOSPITAL_COMMUNITY)
Admission: AD | Admit: 2022-06-05 | Discharge: 2022-06-05 | Disposition: A | Discharge status: Home / Self Care | Payer: Commercial Managed Care - HMO | Attending: Obstetrics and Gynecology | Admitting: Obstetrics and Gynecology

## 2022-06-05 DIAGNOSIS — O99513 Diseases of the respiratory system complicating pregnancy, third trimester: Secondary | ICD-10-CM | POA: Insufficient documentation

## 2022-06-05 DIAGNOSIS — R000 Tachycardia, unspecified: Secondary | ICD-10-CM | POA: Diagnosis not present

## 2022-06-05 DIAGNOSIS — R509 Fever, unspecified: Secondary | ICD-10-CM | POA: Diagnosis not present

## 2022-06-05 DIAGNOSIS — R0602 Shortness of breath: Secondary | ICD-10-CM | POA: Diagnosis not present

## 2022-06-05 DIAGNOSIS — Z1152 Encounter for screening for COVID-19: Secondary | ICD-10-CM | POA: Insufficient documentation

## 2022-06-05 DIAGNOSIS — R058 Other specified cough: Secondary | ICD-10-CM | POA: Insufficient documentation

## 2022-06-05 DIAGNOSIS — J101 Influenza due to other identified influenza virus with other respiratory manifestations: Secondary | ICD-10-CM | POA: Diagnosis not present

## 2022-06-05 DIAGNOSIS — Z79899 Other long term (current) drug therapy: Secondary | ICD-10-CM | POA: Insufficient documentation

## 2022-06-05 DIAGNOSIS — Z3A30 30 weeks gestation of pregnancy: Secondary | ICD-10-CM | POA: Insufficient documentation

## 2022-06-05 LAB — COMPREHENSIVE METABOLIC PANEL
ALT: 27 U/L (ref 0–44)
AST: 52 U/L — ABNORMAL HIGH (ref 15–41)
Albumin: 2.7 g/dL — ABNORMAL LOW (ref 3.5–5.0)
Alkaline Phosphatase: 117 U/L (ref 38–126)
Anion gap: 12 (ref 5–15)
BUN: <5 mg/dL — ABNORMAL LOW (ref 6–20)
CO2: 21 mmol/L — ABNORMAL LOW (ref 22–32)
Calcium: 8.9 mg/dL (ref 8.9–10.3)
Chloride: 98 mmol/L (ref 98–111)
Creatinine, Ser: 0.41 mg/dL — ABNORMAL LOW (ref 0.44–1.00)
GFR, Estimated: >60 mL/min (ref >60)
Glucose, Bld: 93 mg/dL (ref 70–99)
Potassium: 3.6 mmol/L (ref 3.5–5.1)
Sodium: 131 mmol/L — ABNORMAL LOW (ref 135–145)
Total Bilirubin: 0.9 mg/dL (ref 0.3–1.2)
Total Protein: 6.5 g/dL (ref 6.5–8.1)

## 2022-06-05 LAB — CBC
HCT: 31.9 % — ABNORMAL LOW (ref 36.0–46.0)
Hemoglobin: 10.1 g/dL — ABNORMAL LOW (ref 12.0–15.0)
MCH: 23 pg — ABNORMAL LOW (ref 26.0–34.0)
MCHC: 31.7 g/dL (ref 30.0–36.0)
MCV: 72.7 fL — ABNORMAL LOW (ref 80.0–100.0)
Platelets: 271 10*3/uL (ref 150–400)
RBC: 4.39 MIL/uL (ref 3.87–5.11)
RDW: 15.5 % (ref 11.5–15.5)
WBC: 7.8 10*3/uL (ref 4.0–10.5)
nRBC: 0 % (ref 0.0–0.2)

## 2022-06-05 LAB — URINALYSIS, ROUTINE W REFLEX MICROSCOPIC
Bilirubin Urine: NEGATIVE
Glucose, UA: NEGATIVE mg/dL
Hgb urine dipstick: NEGATIVE
Ketones, ur: NEGATIVE mg/dL
Leukocytes,Ua: NEGATIVE
Nitrite: NEGATIVE
Protein, ur: NEGATIVE mg/dL
Specific Gravity, Urine: <1.005 — ABNORMAL LOW (ref 1.005–1.030)
pH: 7 (ref 5.0–8.0)

## 2022-06-05 LAB — GROUP A STREP BY PCR: Group A Strep by PCR: NOT DETECTED

## 2022-06-05 LAB — RESP PANEL BY RT-PCR (RSV, FLU A&B, COVID)  RVPGX2
Influenza A by PCR: NEGATIVE
Influenza B by PCR: POSITIVE — AB
Resp Syncytial Virus by PCR: NEGATIVE
SARS Coronavirus 2 by RT PCR: NEGATIVE

## 2022-06-05 MED ORDER — LACTATED RINGERS IV BOLUS
1000.0000 mL | Freq: Once | INTRAVENOUS | Status: AC
  Administered 2022-06-05: 1000 mL via INTRAVENOUS

## 2022-06-05 MED ORDER — OSELTAMIVIR PHOSPHATE 75 MG PO CAPS: 75.0000 mg | ORAL_CAPSULE | Freq: Two times a day (BID) | ORAL | 0 refills | Status: DC

## 2022-06-05 MED ORDER — ACETAMINOPHEN 500 MG PO TABS
1000.0000 mg | ORAL_TABLET | Freq: Once | ORAL | Status: AC
  Administered 2022-06-05: 1000 mg via ORAL
  Filled 2022-06-05: qty 2

## 2022-06-05 NOTE — MAU Note (Signed)
States came into MAU today because of cough, body aches, sore throat, fever, and chills. States has had symptoms for approximately one week.

## 2022-06-05 NOTE — MAU Note (Signed)
.  Kelsey [name] is a 32 y.o. at 72w2dhere in MAU reporting: fever since last Sunday, generalized body aches, cough, and weakness.  Reports temp is never under 100.0, no fever in triage.  Reports she took 1 tylenol at 12pm today with no relief.  Husband reports she is unable to walk or talk much.  Denies ctx, LOF, or vag bleeding.  Onset of complaint: 1 week  Pain score: 8 Vitals:   06/05/22 1431  BP: 103/63  Resp: 17  Temp: 97.9 F (36.6 C)     FHT:155 Lab orders placed from triage:   ua

## 2022-06-05 NOTE — MAU Provider Note (Signed)
Chief Complaint:  Generalized Body Aches, Fever, and Cough   None     HPI: Kelsey [name] is a 32 y.o. 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  She reports some shortness of breath as well.  She took Tylenol 500 mg at noon without relief.  She was seen in MAU on 06/03/22 and had negative testing for flu, Covid, and RSV.  Symptoms have not improved.  She reports temp of 100-102 at home.     HPI  Past Medical History: Past Medical History:  Diagnosis Date   Anemia    Rheumatic fever     Past obstetric history: OB History  Gravida Para Term Preterm AB Living  4 2 2   1 2  $ SAB IAB Ectopic Multiple Live Births  1     0 2    # Outcome Date GA Lbr Len/2nd Weight Sex Delivery Anes PTL Lv  4 Current           3 Term 11/14/16 [id number] g M Vag-Spont EPI  LIV  2 Term 11/10/14 [id number] g M Vag-Spont EPI  LIV  1 SAB             Past Surgical History: Past Surgical History:  Procedure Laterality Date   TONSILLECTOMY      Family History: Family History  Problem Relation Age of Onset   Hypertension Mother    Hypertension Father     Social History: Social History   Tobacco Use   Smoking status: Never   Smokeless tobacco: Never  Vaping Use   Vaping Use: Never used  Substance Use Topics   Alcohol use: No   Drug use: No    Allergies:  Allergies  Allergen Reactions   No Known Allergies     Meds:  Medications Prior to Admission  Medication Sig Dispense Refill Last Dose   acetaminophen (TYLENOL) 500 MG tablet Take 500 mg by mouth every 6 (six) hours as needed for fever, headache or mild pain.   06/05/2022 at 1200   ferrous sulfate 325 (65 FE) MG tablet Take 325 mg by mouth daily with breakfast.   Past Week   Cholecalciferol 50 MCG (2000 UT) CAPS    More than a month   doxylamine, Sleep, (UNISOM) 25 MG tablet Take 0.5-1 tablets (12.5-25 mg total) by mouth at bedtime as needed  (nausea). 30 tablet 3 More than a month   ondansetron (ZOFRAN) 4 MG tablet Take 1 tablet (4 mg total) by mouth every 8 (eight) hours as needed for nausea or vomiting. 20 tablet 0 More than a month   Prenatal Vit-Fe Fumarate-FA (MULTIVITAMIN-PRENATAL) 27-0.8 MG TABS tablet Take 1 tablet by mouth daily at 12 noon.   More than a month   propranolol (INDERAL) 10 MG tablet Take 1 tablet (10 mg total) by mouth at bedtime. 90 tablet 3 More than a month   Pyridoxine HCl (VITAMIN B-6 PO) Take by mouth.   More than a month    ROS:  Review of Systems  Constitutional:  Positive for fever. Negative for chills and fatigue.  Eyes:  Negative for visual disturbance.  Respiratory:  Negative for shortness of breath.   Cardiovascular:  Negative for chest pain.  Gastrointestinal:  Negative for abdominal pain, nausea and vomiting.  Genitourinary:  Negative for difficulty urinating, dysuria, flank pain, pelvic pain, vaginal bleeding, vaginal discharge and vaginal pain.  Neurological:  Negative for dizziness and headaches.  Psychiatric/Behavioral: Negative.       I have reviewed patient's Past Medical Hx, Surgical Hx, Family Hx, Social Hx, medications and allergies.   Physical Exam  Patient Vitals for the past 24 hrs:  BP Temp Temp src Pulse Resp SpO2 Height  06/05/22 1918 109/62 -- -- -- -- -- --  06/05/22 1810 104/65 -- -- (!) 115 -- 100 % --  06/05/22 1809 104/65 98.1 F (36.7 C) Oral (!) 114 16 100 % --  06/05/22 1805 -- -- -- -- -- 98 % --  06/05/22 1800 -- -- -- -- -- 99 % --  06/05/22 1755 -- -- -- -- -- 100 % --  06/05/22 1750 -- -- -- -- -- 98 % --  06/05/22 1745 -- -- -- -- -- 100 % --  06/05/22 1740 -- -- -- -- -- 100 % --  06/05/22 1735 -- -- -- -- -- 100 % --  06/05/22 1730 -- -- -- -- -- 100 % --  06/05/22 1725 -- -- -- -- -- 99 % --  06/05/22 1720 -- -- -- -- -- 100 % --  06/05/22 1715 -- -- -- -- -- 99 % --  06/05/22 1710 -- -- -- -- -- 100 % --  06/05/22 1705 -- -- -- -- -- 100 % --   06/05/22 1655 -- -- -- -- -- 100 % --  06/05/22 1650 -- -- -- -- -- 100 % --  06/05/22 1645 -- -- -- -- -- 100 % --  06/05/22 1640 -- -- -- -- -- 100 % --  06/05/22 1635 -- -- -- -- -- 100 % --  06/05/22 1630 -- -- -- -- -- 100 % --  06/05/22 1625 -- -- -- -- -- 100 % --  06/05/22 1620 -- -- -- -- -- 100 % --  06/05/22 1615 -- -- -- -- -- 99 % --  06/05/22 1610 -- -- -- -- -- 100 % --  06/05/22 1605 -- -- -- -- -- 100 % --  06/05/22 1600 -- -- -- -- -- 100 % --  06/05/22 1555 -- -- -- -- -- 100 % --  06/05/22 1550 -- -- -- -- -- 99 % --  06/05/22 1545 -- -- -- -- -- 99 % --  06/05/22 1540 -- -- -- -- -- 100 % --  06/05/22 1535 -- -- -- -- -- 100 % --  06/05/22 1530 -- -- -- -- -- 100 % --  06/05/22 1525 -- -- -- -- -- 100 % --  06/05/22 1520 -- -- -- -- -- 98 % --  06/05/22 1515 -- -- -- -- -- 99 % --  06/05/22 1510 -- -- -- -- -- 98 % --  06/05/22 1505 104/60 -- -- (!) 110 -- 99 % --  06/05/22 1503 104/60 97.6 F (36.4 C) Oral (!) 109 18 99 % 5' 6"$  (1.676 m)  06/05/22 1500 -- -- -- -- -- 97 % --  06/05/22 1455 -- -- -- -- -- 97 % --  06/05/22 1450 -- -- -- -- -- 98 % --  06/05/22 1445 -- -- -- -- -- 98 % --  06/05/22 1440 -- -- -- -- -- 98 % --  06/05/22 1435 103/63 -- -- (!) 104 -- 100 % --  06/05/22 1434 -- -- -- 98 -- 100 % --  06/05/22 1431 103/63 97.9 F (36.6 C) Oral -- 17 -- --  06/05/22 1430 Marland Kitchen)  87/51 -- -- (!) 112 -- 100 % --   Constitutional: Well-developed, well-nourished female in no acute distress.  Cardiovascular: normal rate Respiratory: normal effort GI: Abd soft, non-tender, gravid appropriate for gestational age.  MS: Extremities nontender, no edema, normal ROM Neurologic: Alert and oriented x 4.  GU: Neg CVAT.  PELVIC EXAM: Deferred     FHT:  Baseline 145 , moderate variability, accelerations present, no decelerations Contractions: none on toco or to palpation  Labs: Results for orders placed or performed during the hospital encounter of  06/05/22 (from the past 24 hour(s))  Urinalysis, Routine w reflex microscopic -Urine, Clean Catch     Status: Abnormal   Collection Time: 06/05/22  2:44 PM  Result Value Ref Range   Color, Urine YELLOW YELLOW   APPearance CLEAR CLEAR   Specific Gravity, Urine <1.005 (L) 1.005 - 1.030   pH 7.0 5.0 - 8.0   Glucose, UA NEGATIVE NEGATIVE mg/dL   Hgb urine dipstick NEGATIVE NEGATIVE   Bilirubin Urine NEGATIVE NEGATIVE   Ketones, ur NEGATIVE NEGATIVE mg/dL   Protein, ur NEGATIVE NEGATIVE mg/dL   Nitrite NEGATIVE NEGATIVE   Leukocytes,Ua NEGATIVE NEGATIVE  CBC     Status: Abnormal   Collection Time: 06/05/22  4:14 PM  Result Value Ref Range   WBC 7.8 4.0 - 10.5 K/uL   RBC 4.39 3.87 - 5.11 MIL/uL   Hemoglobin 10.1 (L) 12.0 - 15.0 g/dL   HCT 31.9 (L) 36.0 - 46.0 %   MCV 72.7 (L) 80.0 - 100.0 fL   MCH 23.0 (L) 26.0 - 34.0 pg   MCHC 31.7 30.0 - 36.0 g/dL   RDW 15.5 11.5 - 15.5 %   Platelets 271 150 - 400 K/uL   nRBC 0.0 0.0 - 0.2 %  Comprehensive metabolic panel     Status: Abnormal   Collection Time: 06/05/22  4:14 PM  Result Value Ref Range   Sodium 131 (L) 135 - 145 mmol/L   Potassium 3.6 3.5 - 5.1 mmol/L   Chloride 98 98 - 111 mmol/L   CO2 21 (L) 22 - 32 mmol/L   Glucose, Bld 93 70 - 99 mg/dL   BUN <5 (L) 6 - 20 mg/dL   Creatinine, Ser 0.41 (L) 0.44 - 1.00 mg/dL   Calcium 8.9 8.9 - 10.3 mg/dL   Total Protein 6.5 6.5 - 8.1 g/dL   Albumin 2.7 (L) 3.5 - 5.0 g/dL   AST 52 (H) 15 - 41 U/L   ALT 27 0 - 44 U/L   Alkaline Phosphatase 117 38 - 126 U/L   Total Bilirubin 0.9 0.3 - 1.2 mg/dL   GFR, Estimated >60 >60 mL/min   Anion gap 12 5 - 15  Group A Strep by PCR     Status: None   Collection Time: 06/05/22  5:38 PM   Specimen: Throat; Sterile Swab  Result Value Ref Range   Group A Strep by PCR NOT DETECTED NOT DETECTED  Resp panel by RT-PCR (RSV, Flu A&B, Covid) Anterior Nasal Swab     Status: Abnormal   Collection Time: 06/05/22  6:06 PM   Specimen: Anterior Nasal Swab   Result Value Ref Range   SARS Coronavirus 2 by RT PCR NEGATIVE NEGATIVE   Influenza A by PCR NEGATIVE NEGATIVE   Influenza B by PCR POSITIVE (A) NEGATIVE   Resp Syncytial Virus by PCR NEGATIVE NEGATIVE      Imaging:  DG Chest Portable 1 View  Result Date: 06/05/2022 CLINICAL DATA:  Short of breath.  Thirty weeks pregnant. EXAM: PORTABLE CHEST 1 VIEW COMPARISON:  05/03/2014. FINDINGS: Normal heart, mediastinum and hila. Clear lungs.  No pleural effusion or pneumothorax. Skeletal structures are unremarkable. IMPRESSION: No active disease. Electronically Signed   By: Lajean Manes M.D.   On: 06/05/2022 16:08    MAU Course/MDM: Orders Placed This Encounter  Procedures   Resp panel by RT-PCR (RSV, Flu A&B, Covid) Anterior Nasal Swab   Group A Strep by PCR   DG Chest Portable 1 View   Urinalysis, Routine w reflex microscopic -Urine, Clean Catch   CBC   Comprehensive metabolic panel   Airborne and Contact precautions   ED EKG   Discharge patient    Meds ordered this encounter  Medications   lactated ringers bolus 1,000 mL   acetaminophen (TYLENOL) tablet 1,000 mg   oseltamivir (TAMIFLU) 75 MG capsule    Sig: Take 1 capsule (75 mg total) by mouth every 12 (twelve) hours.    Dispense:  10 capsule    Refill:  0    Order Specific Question:   Supervising Provider    Answer:   Merrily Pew     NST reviewed and appropriate for gestational age CXR wnl, no evidence of pneumonia Influenza B positive today, negative on 2/16.  Possible new onset flu superimposed on previous URI or false negative test 2 days ago Will start Tamiflu, not likely effective if pt has had infection x 1 week but may be newer onset Increase oral fluids, rest, f/u in office, return to MAU as needed for emergencies     Assessment: 1. Influenza B   2. [redacted] weeks gestation of pregnancy     Plan: Discharge home Labor precautions and fetal kick counts  Allergies as of 06/05/2022       Reactions   No  Known Allergies         Medication List     TAKE these medications    acetaminophen 500 MG tablet Commonly known as: TYLENOL Take 500 mg by mouth every 6 (six) hours as needed for fever, headache or mild pain.   Cholecalciferol 50 MCG (2000 UT) Caps   doxylamine (Sleep) 25 MG tablet Commonly known as: UNISOM Take 0.5-1 tablets (12.5-25 mg total) by mouth at bedtime as needed (nausea).   ferrous sulfate 325 (65 FE) MG tablet Take 325 mg by mouth daily with breakfast.   multivitamin-prenatal 27-0.8 MG Tabs tablet Take 1 tablet by mouth daily at 12 noon.   ondansetron 4 MG tablet Commonly known as: Zofran Take 1 tablet (4 mg total) by mouth every 8 (eight) hours as needed for nausea or vomiting.   oseltamivir 75 MG capsule Commonly known as: TAMIFLU Take 1 capsule (75 mg total) by mouth every 12 (twelve) hours.   propranolol 10 MG tablet Commonly known as: INDERAL Take 1 tablet (10 mg total) by mouth at bedtime.   VITAMIN B-6 PO Take by mouth.        Fatima Blank Certified Nurse-Midwife 06/05/2022 8:38 PM

## 2022-07-01 ENCOUNTER — Encounter: Payer: Self-pay | Admitting: Cardiology

## 2022-07-01 ENCOUNTER — Ambulatory Visit (INDEPENDENT_AMBULATORY_CARE_PROVIDER_SITE_OTHER): Payer: 59 | Admitting: Cardiology

## 2022-07-01 VITALS — BP 94/70 | HR 116 | Ht 66.0 in | Wt 161.9 lb

## 2022-07-01 DIAGNOSIS — R002 Palpitations: Secondary | ICD-10-CM

## 2022-07-01 NOTE — Progress Notes (Signed)
Cardio-Obstetrics Clinic  New Evaluation  Date:  07/01/2022   ID:  Kelsey [name], DOB [birth year], MRN [id number]  PCP:  Patient, No Pcp Per   [place] [facility] Cardiologist:  Berniece Salines, DO  Electrophysiologist:  None       Referring MD: No ref. provider found   Chief Complaint: " I am short of breath"  History of Present Illness:    Kelsey [name] is a 32 y.o. female [[id number]] who is being seen today for the evaluation of  at the request of No ref. provider found.   She has a hx of Rheumatic fever as a child presents to be evaluated for shortness of  breath and palpiations.   She is currently 34 weeks days - at her last visit I started her on propanolol 10 mg daily. She tells me that she felt worse on the propanolol and stopped it. No other complaints at this time.   Prior CV Studies Reviewed: The following studies were reviewed today: [facility] 03/04/2022 Patch Wear Time:  6 days and 17 hours (2023-10-20T15:22:00-0400 to 2023-10-27T09:08:09-398)   Patient had a min HR of 53 bpm, max HR of 166 bpm, and avg HR of 95 bpm. Predominant underlying rhythm was Sinus Rhythm. Isolated SVEs were rare (<1.0%), SVE Couplets were rare (<1.0%), and SVE Triplets were rare (<1.0%). Isolated VEs were rare (<1.0%,  4), and no VE Couplets or VE Triplets were present.    Symptoms associated with sinus rhythm.    Conclusion: Normal Study.    TTE 02/22/2022 IMPRESSIONS   1. Left ventricular ejection fraction by 3D volume is 69 %. The left  ventricle has normal function. The left ventricle has no regional wall  motion abnormalities. Left ventricular diastolic parameters were normal.   2. Right ventricular systolic function is normal. The right ventricular  size is normal.   3. The mitral valve is normal in structure. No evidence of mitral valve  regurgitation. No evidence of mitral stenosis.   4. The aortic valve is normal in structure. Aortic valve regurgitation is  not visualized.  No aortic stenosis is present.   5. The inferior vena cava is normal in size with greater than 50%  respiratory variability, suggesting right atrial pressure of 3 mmHg.   FINDINGS   Left Ventricle: Left ventricular ejection fraction by 3D volume is 69 %.  The left ventricle has normal function. The left ventricle has no regional  wall motion abnormalities. The left ventricular internal cavity size was  normal in size. There is no left   ventricular hypertrophy. Left ventricular diastolic parameters were  normal.   Right Ventricle: The right ventricular size is normal. No increase in  right ventricular wall thickness. Right ventricular systolic function is  normal.   Left Atrium: Left atrial size was normal in size.   Right Atrium: Right atrial size was normal in size.   Pericardium: There is no evidence of pericardial effusion.   Mitral Valve: The mitral valve is normal in structure. No evidence of  mitral valve regurgitation. No evidence of mitral valve stenosis.   Tricuspid Valve: The tricuspid valve is normal in structure. Tricuspid  valve regurgitation is trivial. No evidence of tricuspid stenosis.   Aortic Valve: The aortic valve is normal in structure. Aortic valve  regurgitation is not visualized. No aortic stenosis is present.   Pulmonic Valve: The pulmonic valve was normal in structure. Pulmonic valve  regurgitation is not visualized. No evidence of pulmonic stenosis.   Aorta: The  aortic root is normal in size and structure.   Venous: The inferior vena cava is normal in size with greater than 50%  respiratory variability, suggesting right atrial pressure of 3 mmHg.   IAS/Shunts: No atrial level shunt detected by color flow Doppler.       Past Medical History:  Diagnosis Date   Anemia    Rheumatic fever     Past Surgical History:  Procedure Laterality Date   TONSILLECTOMY        OB History     Gravida  4   Para  2   Term  2   Preterm      AB  1    Living  2      SAB  1   IAB      Ectopic      Multiple  0   Live Births  2               Current Medications: Current Meds  Medication Sig   acetaminophen (TYLENOL) 500 MG tablet Take 500 mg by mouth every 6 (six) hours as needed for fever, headache or mild pain.   Cholecalciferol 50 MCG (2000 UT) CAPS    doxylamine, Sleep, (UNISOM) 25 MG tablet Take 0.5-1 tablets (12.5-25 mg total) by mouth at bedtime as needed (nausea).   ferrous sulfate 325 (65 FE) MG tablet Take 325 mg by mouth daily with breakfast.   Prenatal Vit-Fe Fumarate-FA (MULTIVITAMIN-PRENATAL) 27-0.8 MG TABS tablet Take 1 tablet by mouth daily at 12 noon.     Allergies:   No known allergies   Social History   Socioeconomic History   Marital status: Married    Spouse name: Not on file   Number of children: Not on file   Years of education: Not on file   Highest education level: Not on file  Occupational History   Not on file  Tobacco Use   Smoking status: Never   Smokeless tobacco: Never  Vaping Use   Vaping Use: Never used  Substance and Sexual Activity   Alcohol use: No   Drug use: No   Sexual activity: Yes  Other Topics Concern   Not on file  Social History Narrative   Not on file   Social Determinants of Health   Financial Resource Strain: Not on file  Food Insecurity: Not on file  Transportation Needs: Not on file  Physical Activity: Not on file  Stress: Not on file  Social Connections: Not on file      Family History  Problem Relation Age of Onset   Hypertension Mother    Hypertension Father       ROS:   Please see the history of present illness.    Reports shortness of breath All other systems reviewed and are negative.   Labs/EKG Reviewed:    EKG:   EKG is was not ordered today.    Recent Labs: 06/05/2022: ALT 27; BUN <5; Creatinine, Ser 0.41; Hemoglobin 10.1; Platelets 271; Potassium 3.6; Sodium 131   Recent Lipid Panel No results found for: "CHOL", "TRIG",  "HDL", "CHOLHDL", "LDLCALC", "LDLDIRECT"  Physical Exam:    VS:  BP 94/70   Pulse (!) 116   Ht 5\' 6"  (1.676 m)   Wt 73.4 kg   LMP 11/05/2021   SpO2 98%   BMI 26.13 kg/m     Wt Readings from Last 3 Encounters:  07/01/22 73.4 kg  06/02/22 71.7 kg  05/06/22 71.5 kg  GEN:  Well nourished, well developed in no acute distress HEENT: Normal NECK: No JVD; No carotid bruits LYMPHATICS: No lymphadenopathy CARDIAC: RRR, 2/6 murmurs, rubs, gallops RESPIRATORY:  Clear to auscultation without rales, wheezing or rhonchi  ABDOMEN: Soft, non-tender, non-distended MUSCULOSKELETAL:  No edema; No deformity  SKIN: Warm and dry NEUROLOGIC:  Alert and oriented x 3 PSYCHIATRIC:  Normal affect    Risk Assessment/Risk Calculators:                  ASSESSMENT & PLAN:    Palpitations Dyspnea on exertion  Hx of Rheumatic fever   She is still experiencing palpitations. She did not tolerate the propanolol, will stop it.  We will just monitor her until delivery.  She will follow up postpartum.   Patient Instructions  Medication Instructions:  Your physician recommends that you continue on your current medications as directed. Please refer to the Current Medication list given to you today.  *If you need a refill on your cardiac medications before your next appointment, please call your pharmacy*   Lab Work: None   Testing/Procedures: None   Follow-Up: At [facility], you and your health needs are our priority.  As part of our continuing mission to provide you with exceptional heart care, we have created designated Provider Care Teams.  These Care Teams include your primary Cardiologist (physician) and Advanced Practice Providers (APPs -  Physician Assistants and Nurse Practitioners) who all work together to provide you with the care you need, when you need it.  Your next appointment:   12 week(s) via MyChart  Provider:   Berniece Salines, DO     Dispo:  No  follow-ups on file.   Medication Adjustments/Labs and Tests Ordered: Current medicines are reviewed at length with the patient today.  Concerns regarding medicines are outlined above.  Tests Ordered: No orders of the defined types were placed in this encounter.  Medication Changes: No orders of the defined types were placed in this encounter.

## 2022-07-01 NOTE — Patient Instructions (Signed)
Medication Instructions:  Your physician recommends that you continue on your current medications as directed. Please refer to the Current Medication list given to you today.  *If you need a refill on your cardiac medications before your next appointment, please call your pharmacy*   Lab Work: None   Testing/Procedures: None   Follow-Up: At [facility], you and your health needs are our priority.  As part of our continuing mission to provide you with exceptional heart care, we have created designated Provider Care Teams.  These Care Teams include your primary Cardiologist (physician) and Advanced Practice Providers (APPs -  Physician Assistants and Nurse Practitioners) who all work together to provide you with the care you need, when you need it.  Your next appointment:   12 week(s) via MyChart  Provider:   Berniece Salines, DO

## 2022-08-05 ENCOUNTER — Inpatient Hospital Stay (HOSPITAL_COMMUNITY)
Admission: AD | Admit: 2022-08-05 | Discharge: 2022-08-07 | DRG: 807 | Disposition: A | Discharge status: Home / Self Care | Payer: 59 | Attending: Obstetrics and Gynecology | Admitting: Obstetrics and Gynecology

## 2022-08-05 DIAGNOSIS — D649 Anemia, unspecified: Secondary | ICD-10-CM | POA: Diagnosis present

## 2022-08-05 DIAGNOSIS — O9902 Anemia complicating childbirth: Principal | ICD-10-CM | POA: Diagnosis present

## 2022-08-05 DIAGNOSIS — Z8679 Personal history of other diseases of the circulatory system: Secondary | ICD-10-CM

## 2022-08-05 DIAGNOSIS — Z3A39 39 weeks gestation of pregnancy: Secondary | ICD-10-CM

## 2022-08-05 NOTE — MAU Note (Signed)
Pt says SROM- 9pm [facility]- [facility] ON Monday - VE_ soft  Denies HSV GBS- neg UC's strong -since 2300

## 2022-08-06 ENCOUNTER — Other Ambulatory Visit: Payer: Self-pay

## 2022-08-06 ENCOUNTER — Encounter (HOSPITAL_COMMUNITY): Payer: Self-pay | Admitting: Obstetrics and Gynecology

## 2022-08-06 ENCOUNTER — Inpatient Hospital Stay (HOSPITAL_COMMUNITY): Payer: 59 | Admitting: Anesthesiology

## 2022-08-06 DIAGNOSIS — Z3A39 39 weeks gestation of pregnancy: Secondary | ICD-10-CM | POA: Diagnosis not present

## 2022-08-06 DIAGNOSIS — O9902 Anemia complicating childbirth: Secondary | ICD-10-CM | POA: Diagnosis not present

## 2022-08-06 DIAGNOSIS — Z23 Encounter for immunization: Secondary | ICD-10-CM | POA: Diagnosis not present

## 2022-08-06 DIAGNOSIS — D649 Anemia, unspecified: Secondary | ICD-10-CM | POA: Diagnosis not present

## 2022-08-06 DIAGNOSIS — O26893 Other specified pregnancy related conditions, third trimester: Secondary | ICD-10-CM | POA: Diagnosis not present

## 2022-08-06 LAB — CBC
HCT: 32.6 % — ABNORMAL LOW (ref 36.0–46.0)
Hemoglobin: 9.6 g/dL — ABNORMAL LOW (ref 12.0–15.0)
MCH: 21.1 pg — ABNORMAL LOW (ref 26.0–34.0)
MCHC: 29.4 g/dL — ABNORMAL LOW (ref 30.0–36.0)
MCV: 71.5 fL — ABNORMAL LOW (ref 80.0–100.0)
Platelets: 308 10*3/uL (ref 150–400)
RBC: 4.56 MIL/uL (ref 3.87–5.11)
RDW: 18.5 % — ABNORMAL HIGH (ref 11.5–15.5)
WBC: 10.1 10*3/uL (ref 4.0–10.5)
nRBC: 0 % (ref 0.0–0.2)

## 2022-08-06 LAB — RPR: RPR Ser Ql: NONREACTIVE

## 2022-08-06 LAB — HIV ANTIBODY (ROUTINE TESTING W REFLEX): HIV Screen 4th Generation wRfx: NONREACTIVE

## 2022-08-06 LAB — TYPE AND SCREEN
ABO/RH(D): AB POS
Antibody Screen: NEGATIVE

## 2022-08-06 MED ORDER — LACTATED RINGERS IV SOLN: INTRAVENOUS | Status: DC

## 2022-08-06 MED ORDER — IBUPROFEN 600 MG PO TABS
600.0000 mg | ORAL_TABLET | Freq: Four times a day (QID) | ORAL | Status: DC
  Administered 2022-08-06 – 2022-08-07 (×5): 600 mg via ORAL
  Filled 2022-08-06 (×5): qty 1

## 2022-08-06 MED ORDER — COCONUT OIL OIL: 1.0000 | TOPICAL_OIL | Status: DC | PRN

## 2022-08-06 MED ORDER — OXYCODONE-ACETAMINOPHEN 5-325 MG PO TABS: 2.0000 | ORAL_TABLET | ORAL | Status: DC | PRN

## 2022-08-06 MED ORDER — OXYTOCIN BOLUS FROM INFUSION
333.0000 mL | Freq: Once | INTRAVENOUS | Status: AC
  Administered 2022-08-06: 333 mL via INTRAVENOUS

## 2022-08-06 MED ORDER — BENZOCAINE-MENTHOL 20-0.5 % EX AERO: 1.0000 | INHALATION_SPRAY | CUTANEOUS | Status: DC | PRN

## 2022-08-06 MED ORDER — ONDANSETRON HCL 4 MG/2ML IJ SOLN: 4.0000 mg | Freq: Four times a day (QID) | INTRAMUSCULAR | Status: DC | PRN

## 2022-08-06 MED ORDER — ACETAMINOPHEN 325 MG PO TABS
650.0000 mg | ORAL_TABLET | ORAL | Status: DC | PRN
  Administered 2022-08-06 – 2022-08-07 (×3): 650 mg via ORAL
  Filled 2022-08-06 (×3): qty 2

## 2022-08-06 MED ORDER — DIBUCAINE (PERIANAL) 1 % EX OINT: 1.0000 | TOPICAL_OINTMENT | CUTANEOUS | Status: DC | PRN

## 2022-08-06 MED ORDER — TETANUS-DIPHTH-ACELL PERTUSSIS 5-2.5-18.5 LF-MCG/0.5 IM SUSY: 0.5000 mL | PREFILLED_SYRINGE | Freq: Once | INTRAMUSCULAR | Status: DC

## 2022-08-06 MED ORDER — LIDOCAINE-EPINEPHRINE (PF) 1.5 %-1:200000 IJ SOLN
INTRAMUSCULAR | Status: DC | PRN
  Administered 2022-08-06: 5 mL via PERINEURAL

## 2022-08-06 MED ORDER — EPHEDRINE 5 MG/ML INJ: 10.0000 mg | INTRAVENOUS | Status: DC | PRN

## 2022-08-06 MED ORDER — SENNOSIDES-DOCUSATE SODIUM 8.6-50 MG PO TABS
2.0000 | ORAL_TABLET | ORAL | Status: DC
  Administered 2022-08-06 – 2022-08-07 (×2): 2 via ORAL
  Filled 2022-08-06 (×2): qty 2

## 2022-08-06 MED ORDER — PHENYLEPHRINE 80 MCG/ML (10ML) SYRINGE FOR IV PUSH (FOR BLOOD PRESSURE SUPPORT)
80.0000 ug | PREFILLED_SYRINGE | INTRAVENOUS | Status: DC | PRN
  Filled 2022-08-06: qty 10

## 2022-08-06 MED ORDER — ONDANSETRON HCL 4 MG/2ML IJ SOLN: 4.0000 mg | INTRAMUSCULAR | Status: DC | PRN

## 2022-08-06 MED ORDER — ONDANSETRON HCL 4 MG PO TABS: 4.0000 mg | ORAL_TABLET | ORAL | Status: DC | PRN

## 2022-08-06 MED ORDER — PHENYLEPHRINE 80 MCG/ML (10ML) SYRINGE FOR IV PUSH (FOR BLOOD PRESSURE SUPPORT): 80.0000 ug | PREFILLED_SYRINGE | INTRAVENOUS | Status: DC | PRN

## 2022-08-06 MED ORDER — LACTATED RINGERS IV SOLN: 500.0000 mL | INTRAVENOUS | Status: DC | PRN

## 2022-08-06 MED ORDER — DIPHENHYDRAMINE HCL 50 MG/ML IJ SOLN: 12.5000 mg | INTRAMUSCULAR | Status: DC | PRN

## 2022-08-06 MED ORDER — ACETAMINOPHEN 325 MG PO TABS: 650.0000 mg | ORAL_TABLET | ORAL | Status: DC | PRN

## 2022-08-06 MED ORDER — SOD CITRATE-CITRIC ACID 500-334 MG/5ML PO SOLN: 30.0000 mL | ORAL | Status: DC | PRN

## 2022-08-06 MED ORDER — FENTANYL-BUPIVACAINE-NACL 0.5-0.125-0.9 MG/250ML-% EP SOLN
12.0000 mL/h | EPIDURAL | Status: DC | PRN
  Administered 2022-08-06: 12 mL/h via EPIDURAL
  Filled 2022-08-06: qty 250

## 2022-08-06 MED ORDER — SIMETHICONE 80 MG PO CHEW: 80.0000 mg | CHEWABLE_TABLET | ORAL | Status: DC | PRN

## 2022-08-06 MED ORDER — WITCH HAZEL-GLYCERIN EX PADS: 1.0000 | MEDICATED_PAD | CUTANEOUS | Status: DC | PRN

## 2022-08-06 MED ORDER — LACTATED RINGERS IV SOLN: 500.0000 mL | Freq: Once | INTRAVENOUS | Status: DC

## 2022-08-06 MED ORDER — OXYTOCIN-SODIUM CHLORIDE 30-0.9 UT/500ML-% IV SOLN
2.5000 [IU]/h | INTRAVENOUS | Status: DC
  Administered 2022-08-06: 2.5 [IU]/h via INTRAVENOUS
  Filled 2022-08-06: qty 500

## 2022-08-06 MED ORDER — LIDOCAINE HCL (PF) 1 % IJ SOLN
INTRAMUSCULAR | Status: DC | PRN
  Administered 2022-08-06: 5 mL via EPIDURAL

## 2022-08-06 MED ORDER — LIDOCAINE HCL (PF) 1 % IJ SOLN: 30.0000 mL | INTRAMUSCULAR | Status: DC | PRN

## 2022-08-06 MED ORDER — FLEET ENEMA 7-19 GM/118ML RE ENEM: 1.0000 | ENEMA | RECTAL | Status: DC | PRN

## 2022-08-06 MED ORDER — DIPHENHYDRAMINE HCL 25 MG PO CAPS: 25.0000 mg | ORAL_CAPSULE | Freq: Four times a day (QID) | ORAL | Status: DC | PRN

## 2022-08-06 MED ORDER — PRENATAL MULTIVITAMIN CH
1.0000 | ORAL_TABLET | Freq: Every day | ORAL | Status: DC
  Administered 2022-08-06 – 2022-08-07 (×2): 1 via ORAL
  Filled 2022-08-06 (×2): qty 1

## 2022-08-06 MED ORDER — OXYCODONE-ACETAMINOPHEN 5-325 MG PO TABS: 1.0000 | ORAL_TABLET | ORAL | Status: DC | PRN

## 2022-08-06 NOTE — Progress Notes (Signed)
Subjective:    Comfortable with epidural, resting.   Objective:    VS: BP 107/79   Pulse 96   Temp 97.9 F (36.6 C) (Oral)   Resp 16   Ht  (1.676 m)   Wt 75.8 kg   LMP 11/05/2021   SpO2 97%   BMI 26.95 kg/m  FHR : baseline 145 / variability moderate / accelerations present / absent decelerations Toco: contractions every 3 minutes  Membranes: SROM, clear Dilation: 8 Effacement (%): 80 Cervical Position: Anterior Station: 0 Presentation: Vertex Exam by:: [name], CNM   Assessment/Plan:   32 y.o. Z6X0960 [redacted]w[redacted]d Normal labor SROM Expectant management  Labor: Progressing normally Fetal Wellbeing:  Category I Pain Control:  Epidural I/D:   GBS neg Anticipated MOD:  NSVD  Kelsey Schanz DNP, CNM 08/06/2022 5:10 AM

## 2022-08-06 NOTE — Anesthesia Postprocedure Evaluation (Signed)
Anesthesia Post Note  Patient: Kelsey [name]  Procedure(s) Performed: AN AD HOC LABOR EPIDURAL     Patient location during evaluation: Mother Baby Anesthesia Type: Epidural Level of consciousness: awake, oriented and awake and alert Pain management: pain level controlled Vital Signs Assessment: post-procedure vital signs reviewed and stable Respiratory status: spontaneous breathing, respiratory function stable and nonlabored ventilation Cardiovascular status: stable Postop Assessment: no headache, adequate PO intake, able to ambulate, patient able to bend at knees and no apparent nausea or vomiting Anesthetic complications: no   No notable events documented.  Last Vitals:  Vitals:   08/06/22 0940 08/06/22 1500  BP: 111/68 117/73  Pulse: 84 75  Resp:    Temp:    SpO2:      Last Pain:  Vitals:   08/06/22 1720  TempSrc:   PainSc: 5    Pain Goal:                Epidural/Spinal Function Cutaneous sensation: Normal sensation (08/06/22 1720), Patient able to flex knees: Yes (08/06/22 1720), Patient able to lift hips off bed: Yes (08/06/22 1720), Back pain beyond tenderness at insertion site: No (08/06/22 1720), Progressively worsening motor and/or sensory loss: No (08/06/22 1720), Bowel and/or bladder incontinence post epidural: No (08/06/22 1720)  Jada Kuhnert

## 2022-08-06 NOTE — Lactation Note (Signed)
This note was copied from a baby's chart. Lactation Consultation Note  Patient Name: Kelsey [name] Today's Date: 08/06/2022 Age:30 hours Reason for consult: Initial assessment;Term;Breastfeeding assistance  LC delivered the birth parent's stork pump to her room.    Maternal Data Has patient been taught Hand Expression?: Yes Does the patient have breastfeeding experience prior to this delivery?: Yes How long did the patient breastfeed?: She breast fed both of her other children for 1 year  Feeding Mother's Current Feeding Choice: Breast Milk  LATCH Score                    Lactation Tools Discussed/Used    Interventions Interventions: [facility] Services brochure  Discharge    Consult Status Consult Status: PRN Follow-up type: Call as needed    Delene Loll 08/06/2022, 12:08 PM

## 2022-08-06 NOTE — H&P (Signed)
OB ADMISSION/ HISTORY & PHYSICAL:  Admission Date: 08/05/2022 11:19 PM  Admit Diagnosis: Normal labor  Kelsey [name] is a 32 y.o. female Z6X0960 [redacted]w[redacted]d presenting for loss of fluid at 2100. Endorses active FM, denies vaginal bleeding. Ctx began @ 2300. This is a low-risk pregnancy, pt declined NIPS testing, hx of SVD x2 without complications.   History of current pregnancy: [id number]   Patient entered care with [facility] at 8 wks.   EDC 08/10/22 by 8 wk [place]    Anatomy scan:  complete w/ anterior fundal placenta.   Antenatal testing: N/A  Significant prenatal events:  Patient Active Problem List   Diagnosis Date Noted   Normal labor and delivery 08/06/2022   Anemia 12/07/2021   Hx of rheumatic fever 09/10/2014    Prenatal Labs: ABO, Rh: --/--/AB POS (04/20 0015) Antibody: NEG (04/20 0015) Rubella: Immune (09/13 0000)  RPR:   NR HBsAg: Negative (09/13 0000)  HIV: Non Reactive (04/20 0015)  GTT: normal 1 hr  GBS:   neg GC/CHL: neg/neg Genetics: declined Vaccines: Tdap: declined Influenza: declined   OB History  Gravida Para Term Preterm AB Living  SAB IAB Ectopic Multiple Live Births  1     0 2    # Outcome Date GA Lbr Len/2nd Weight Sex Delivery Anes PTL Lv  4 Current           3 Term 11/14/16 [redacted]w[redacted]d  3580 g M Vag-Spont EPI  LIV  2 Term 11/10/14 [redacted]w[redacted]d / 01:16 3453 g M Vag-Spont EPI  LIV  1 SAB             Medical / Surgical History: Past medical history:  Past Medical History:  Diagnosis Date   Anemia    Rheumatic fever     Past surgical history:  Past Surgical History:  Procedure Laterality Date   TONSILLECTOMY     Family History:  Family History  Problem Relation Age of Onset   Hypertension Mother    Hypertension Father     Social History:  reports that she has never smoked. She has never used smokeless tobacco. She reports that she does not drink alcohol and does not use drugs.  Allergies: No known allergies   Current Medications at time of  admission:  Prior to Admission medications   Medication Sig Start Date End Date Taking? Authorizing Provider  Prenatal Vit-Fe Fumarate-FA (MULTIVITAMIN-PRENATAL) 27-0.8 MG TABS tablet Take 1 tablet by mouth daily at 12 noon.   Yes [provider]  acetaminophen (TYLENOL) 500 MG tablet Take 500 mg by mouth every 6 (six) hours as needed for fever, headache or mild pain.    [provider]  Cholecalciferol 50 MCG (2000 UT) CAPS  01/04/22   [provider]  doxylamine, Sleep, (UNISOM) 25 MG tablet Take 0.5-1 tablets (12.5-25 mg total) by mouth at bedtime as needed (nausea). 12/22/21   Ndulue, Chiagoziem J, MD  ferrous sulfate 325 (65 FE) MG tablet Take 325 mg by mouth daily with breakfast.    [provider]    Review of Systems: Constitutional: Negative   HENT: Negative   Eyes: Negative   Respiratory: Negative   Cardiovascular: Negative   Gastrointestinal: Negative  Genitourinary: neg for bloody show, pos for LOF   Musculoskeletal: Negative   Skin: Negative   Neurological: Negative   Endo/Heme/Allergies: Negative   Psychiatric/Behavioral: Negative    Physical Exam: VS: Blood pressure 107/79, pulse 96, temperature 97.9 F (  36.6 C), temperature source Oral, resp. rate 16, height  (1.676 m), weight 75.8 kg, last menstrual period 11/05/2021, SpO2 97 %, unknown if currently breastfeeding. AAO x3, no signs of distress Cardiovascular: RRR Respiratory: Unlabored GU/GI: Abdomen gravid, non-tender, non-distended, active FM, vertex Extremities: no edema, negative for pain, tenderness, and cords  Cervical exam:Dilation: 4.5 Effacement (%): 80 Station: -1  FHR: baseline rate 145 / variability moderate / accelerations present / absent decelerations TOCO: 2-4   Prenatal Transfer Tool  Maternal Diabetes: No Genetic Screening: Normal Maternal Ultrasounds/Referrals: Normal Fetal Ultrasounds or other Referrals:  None Maternal Substance Abuse:   No Significant Maternal Medications:  None Significant Maternal Lab Results: Group B Strep negative Number of Prenatal Visits:greater than 3 verified prenatal visits Other Comments:  None    Assessment: 32 y.o. Z6X0960 [redacted]w[redacted]d  Latent stage of labor FHR category 1 GBS neg Pain management plan: epidural   Plan:  Admit to L&D Routine admission orders Epidural PRN Expectant management Dr Hester Mates notified of admission and plan of care  Roma Schanz DNP, CNM 08/06/2022 5:12 AM

## 2022-08-06 NOTE — Anesthesia Procedure Notes (Signed)
Epidural Patient location during procedure: OB Start time: 08/06/2022 12:14 AM End time: 08/06/2022 12:24 AM  Staffing Anesthesiologist: Leonides Grills, MD Performed: anesthesiologist   Preanesthetic Checklist Completed: patient identified, IV checked, site marked, risks and benefits discussed, monitors and equipment checked, pre-op evaluation and timeout performed  Epidural Patient position: sitting Prep: DuraPrep Patient monitoring: heart rate, cardiac monitor, continuous pulse ox and blood pressure Approach: midline Location: L4-L5 Injection technique: LOR air  Needle:  Needle type: Tuohy  Needle gauge: 17 G Needle length: 9 cm Needle insertion depth: 5 cm Catheter type: closed end flexible Catheter size: 19 Gauge Catheter at skin depth: 10 cm Test dose: negative and 1.5% lidocaine with Epi 1:200 K  Assessment Events: blood not aspirated, no cerebrospinal fluid, injection not painful, no injection resistance and negative IV test  Additional Notes Informed consent obtained prior to proceeding including risk of failure, 1% risk of PDPH, risk of minor discomfort and bruising. Discussed alternatives to epidural analgesia and patient desires to proceed.  Timeout performed pre-procedure verifying patient name, procedure, and platelet count.  Patient tolerated procedure well. Reason for block:procedure for pain

## 2022-08-06 NOTE — Anesthesia Preprocedure Evaluation (Signed)
Anesthesia Evaluation  Patient identified by MRN, date of birth, ID band Patient awake    Reviewed: Allergy & Precautions, H&P , NPO status , Patient's Chart, lab work & pertinent test results  History of Anesthesia Complications Negative for: history of anesthetic complications  Airway Mallampati: II  TM Distance: >3 FB Neck ROM: full    Dental no notable dental hx. (+) Teeth Intact   Pulmonary neg pulmonary ROS   Pulmonary exam normal breath sounds clear to auscultation       Cardiovascular negative cardio ROS Normal cardiovascular exam Rhythm:regular Rate:Normal     Neuro/Psych negative neurological ROS  negative psych ROS   GI/Hepatic negative GI ROS, Neg liver ROS,,,  Endo/Other  negative endocrine ROS    Renal/GU negative Renal ROS  negative genitourinary   Musculoskeletal   Abdominal   Peds  Hematology  (+) Blood dyscrasia, anemia   Anesthesia Other Findings   Reproductive/Obstetrics (+) Pregnancy                             Anesthesia Physical Anesthesia Plan  ASA: 2  Anesthesia Plan: Epidural   Post-op Pain Management:    Induction:   PONV Risk Score and Plan:   Airway Management Planned:   Additional Equipment:   Intra-op Plan:   Post-operative Plan:   Informed Consent: I have reviewed the patients History and Physical, chart, labs and discussed the procedure including the risks, benefits and alternatives for the proposed anesthesia with the patient or authorized representative who has indicated his/her understanding and acceptance.       Plan Discussed with:   Anesthesia Plan Comments:        Anesthesia Quick Evaluation  

## 2022-08-06 NOTE — Lactation Note (Signed)
This note was copied from a baby's chart. Lactation Consultation Note  Patient Name: Kelsey [name] Today's Date: 08/06/2022 Age:31 hours Reason for consult: Initial assessment;Term;Breastfeeding assistance  LC entered the room and the birth parent was holding the infant.  Per the birth parent things are going well with breastfeeding.  The birth parent is an experienced breastfeeding parent.  She breast fed her other children for 1 year.  The birth parent did not have any questions or concerns.  LC reviewed the outpatient services brochure.  A stork pump form was sent on her behalf.  The birth parent would like to be seen PRN.   Infant Feeding Plan:  Breastfeed 8+ times in 24 hours according to feeding cues.  Hand express and feed the expressed milk to the infant via a spoon.  Call RN/LC for assistance with breastfeeding.    Maternal Data Has patient been taught Hand Expression?: Yes Does the patient have breastfeeding experience prior to this delivery?: Yes How long did the patient breastfeed?: She breast fed both of her other children for 1 year  Feeding Mother's Current Feeding Choice: Breast Milk  LATCH Score Latch: Grasps breast easily, tongue down, lips flanged, rhythmical sucking.  Audible Swallowing: Spontaneous and intermittent  Type of Nipple: Everted at rest and after stimulation  Comfort (Breast/Nipple): Soft / non-tender  Hold (Positioning): No assistance needed to correctly position infant at breast.  LATCH Score: 10   Lactation Tools Discussed/Used    Interventions Interventions: [facility] Services brochure  Discharge    Consult Status Consult Status: PRN Follow-up type: Call as needed    Delene Loll 08/06/2022, 9:48 AM

## 2022-08-07 LAB — CBC
HCT: 29.4 % — ABNORMAL LOW (ref 36.0–46.0)
Hemoglobin: 9 g/dL — ABNORMAL LOW (ref 12.0–15.0)
MCH: 21.5 pg — ABNORMAL LOW (ref 26.0–34.0)
MCHC: 30.6 g/dL (ref 30.0–36.0)
MCV: 70.2 fL — ABNORMAL LOW (ref 80.0–100.0)
Platelets: 294 10*3/uL (ref 150–400)
RBC: 4.19 MIL/uL (ref 3.87–5.11)
RDW: 18.5 % — ABNORMAL HIGH (ref 11.5–15.5)
WBC: 14.4 10*3/uL — ABNORMAL HIGH (ref 4.0–10.5)
nRBC: 0 % (ref 0.0–0.2)

## 2022-08-07 MED ORDER — FERROUS GLUCONATE 324 (38 FE) MG PO TABS: 324.0000 mg | ORAL_TABLET | Freq: Every day | ORAL | 11 refills | Status: DC

## 2022-08-07 MED ORDER — IBUPROFEN 600 MG PO TABS: 600.0000 mg | ORAL_TABLET | Freq: Four times a day (QID) | ORAL | 0 refills | Status: AC

## 2022-08-07 MED ORDER — OXYCODONE HCL 5 MG PO TABS
5.0000 mg | ORAL_TABLET | Freq: Once | ORAL | Status: AC
  Administered 2022-08-07: 5 mg via ORAL
  Filled 2022-08-07: qty 1

## 2022-08-07 MED ORDER — DOCUSATE SODIUM 100 MG PO CAPS: 100.0000 mg | ORAL_CAPSULE | Freq: Two times a day (BID) | ORAL | 0 refills | Status: AC

## 2022-08-07 NOTE — Discharge Summary (Signed)
OB Discharge Summary     Patient Name: Kelsey [name] DOB: June 05, 1990 MRN: [id number]  Date of admission: 08/05/2022 Delivering MD: [name] B   Date of discharge: 08/07/2022  Admitting diagnosis: Normal labor and delivery [O80] Intrauterine pregnancy: [redacted]w[redacted]d     Secondary diagnosis:  Principal Problem:   Normal labor and delivery Active Problems:   Hx of rheumatic fever   Anemia  Additional problems: None     Discharge diagnosis: Term Pregnancy Delivered                                                                                                Post partum procedures: None  Augmentation: N/A  Complications: None  Hospital course:  Onset of Labor With Vaginal Delivery      32 y.o. yo [phone] at [redacted]w[redacted]d was admitted in Active Labor on 08/05/2022. Labor course was complicated by none  Membrane Rupture Time/Date: 9:00 PM ,08/05/2022   Delivery Method:Vaginal, Spontaneous  Episiotomy: None  Lacerations:    Patient had a postpartum course complicated by  none.  She is ambulating, tolerating a regular diet, passing flatus, and urinating well. Patient is discharged home in stable condition on 08/07/22.  Newborn Data: Birth date:08/06/2022  Birth time:6:39 AM  Gender:Female  Living status:Living  Apgars:9 ,9  Weight:3440 g   Physical exam  Vitals:   08/06/22 0940 08/06/22 1500 08/06/22 2123 08/07/22 0533  BP: 111/68 117/73 102/74 105/74  Pulse: 84 75 85 79  Resp:   18 18  Temp:   97.9 F (36.6 C) (!) 97.3 F (36.3 C)  TempSrc:   Oral Oral  SpO2:   99% 99%  Weight:      Height:       General: alert, cooperative, and no distress Lochia: appropriate Uterine Fundus: firm Incision: N/A DVT Evaluation: No evidence of DVT seen on physical exam. Negative Homan's sign. No cords or calf tenderness. No significant calf/ankle edema. Labs: Lab Results  Component Value Date   WBC 14.4 (H) 08/07/2022   HGB 9.0 (L) 08/07/2022   HCT 29.4 (L) 08/07/2022   MCV 70.2 (L)  08/07/2022   PLT 294 08/07/2022      Latest Ref Rng & Units 06/05/2022    4:14 PM  CMP  Glucose 70 - 99 mg/dL 93   BUN 6 - 20 mg/dL <5   Creatinine 1.47 - 1.00 mg/dL 8.29   Sodium 562 - 130 mmol/L 131   Potassium 3.5 - 5.1 mmol/L 3.6   Chloride 98 - 111 mmol/L 98   CO2 22 - 32 mmol/L 21   Calcium 8.9 - 10.3 mg/dL 8.9   Total Protein 6.5 - 8.1 g/dL 6.5   Total Bilirubin 0.3 - 1.2 mg/dL 0.9   Alkaline Phos 38 - 126 U/L 117   AST 15 - 41 U/L 52   ALT 0 - 44 U/L 27     Discharge instruction: per After Visit Summary and "Baby and Me Booklet".  After visit meds:  Allergies as of 08/07/2022       Reactions   No Known Allergies  Medication List     STOP taking these medications    doxylamine (Sleep) 25 MG tablet Commonly known as: UNISOM       TAKE these medications    acetaminophen 500 MG tablet Commonly known as: TYLENOL Take 500 mg by mouth every 6 (six) hours as needed for fever, headache or mild pain.   Cholecalciferol 50 MCG (2000 UT) Caps   docusate sodium 100 MG capsule Commonly known as: Colace Take 1 capsule (100 mg total) by mouth 2 (two) times daily for 10 days.   ferrous gluconate 324 MG tablet Commonly known as: FERGON Take 1 tablet (324 mg total) by mouth daily with breakfast.   ferrous sulfate 325 (65 FE) MG tablet Take 325 mg by mouth daily with breakfast.   ibuprofen 600 MG tablet Commonly known as: ADVIL Take 1 tablet (600 mg total) by mouth every 6 (six) hours for 10 days.   multivitamin-prenatal 27-0.8 MG Tabs tablet Take 1 tablet by mouth daily at 12 noon.        Diet: routine diet, low salt diet, and iron [name]  Activity: Advance as tolerated. Pelvic rest for 6 weeks.   Outpatient follow up:6 weeks Follow up Appt: Future Appointments  Date Time Provider Department Center  09/13/2022  9:00 AM Tobb, Lavona Mound, DO [facility] None   Follow up Visit:No follow-ups on file.  Postpartum contraception:  Condoms  Newborn Data: Live born female  Birth Weight: 7 lb 9.3 oz (3440 g) APGAR: 9, 9  Newborn Delivery   Birth date/time: 08/06/2022 06:39:22 Delivery type: Vaginal, Spontaneous      Baby Feeding: Breast Disposition:home with mother     08/06/2022    8:40 AM  Kelsey [name] Postnatal Depression Scale Screening Tool  I have been able to laugh and see the funny side of things. 0  I have looked forward with enjoyment to things. 0  I have blamed myself unnecessarily when things went wrong. 0  I have been anxious or worried for no good reason. 0  I have felt scared or panicky for no good reason. 0  Things have been getting on top of me. 0  I have been so unhappy that I have had difficulty sleeping. 0  I have felt sad or miserable. 0  I have been so unhappy that I have been crying. 0  The thought of harming myself has occurred to me. 0  Edinburgh Postnatal Depression Scale Total 0       08/07/2022 Kelsey Plum, MD

## 2022-08-16 ENCOUNTER — Telehealth (HOSPITAL_COMMUNITY): Payer: Self-pay | Admitting: *Deleted

## 2022-08-16 NOTE — Telephone Encounter (Signed)
Left phone voicemail message.  Duffy Rhody, RN 08-16-2022 at 1;39pm

## 2022-09-02 ENCOUNTER — Telehealth: Payer: 59 | Admitting: Cardiology

## 2022-09-13 ENCOUNTER — Telehealth: Payer: Self-pay

## 2022-09-13 ENCOUNTER — Ambulatory Visit: Payer: 59 | Admitting: Cardiology

## 2022-09-13 NOTE — Telephone Encounter (Signed)
Call pt to reschedule her appointment from today due to pt not having a blood pressure cuff. Pt unable to reschedule for Friday. She is traveling till the end of summer. Pt will call back to schedule.

## 2022-09-14 DIAGNOSIS — E559 Vitamin D deficiency, unspecified: Secondary | ICD-10-CM | POA: Diagnosis not present

## 2023-02-27 DIAGNOSIS — Z124 Encounter for screening for malignant neoplasm of cervix: Secondary | ICD-10-CM | POA: Diagnosis not present

## 2023-02-27 DIAGNOSIS — E559 Vitamin D deficiency, unspecified: Secondary | ICD-10-CM | POA: Diagnosis not present

## 2024-01-22 ENCOUNTER — Emergency Department (HOSPITAL_COMMUNITY)
Admission: EM | Admit: 2024-01-22 | Discharge: 2024-01-23 | Discharge status: Against Medical Advice | Payer: Self-pay | Attending: Emergency Medicine | Admitting: Emergency Medicine

## 2024-01-22 ENCOUNTER — Other Ambulatory Visit: Payer: Self-pay

## 2024-01-22 ENCOUNTER — Encounter (HOSPITAL_COMMUNITY): Payer: Self-pay

## 2024-01-22 ENCOUNTER — Emergency Department (HOSPITAL_COMMUNITY): Payer: Self-pay

## 2024-01-22 DIAGNOSIS — M79622 Pain in left upper arm: Secondary | ICD-10-CM | POA: Insufficient documentation

## 2024-01-22 DIAGNOSIS — Z5321 Procedure and treatment not carried out due to patient leaving prior to being seen by health care provider: Secondary | ICD-10-CM | POA: Insufficient documentation

## 2024-01-22 LAB — BASIC METABOLIC PANEL WITH GFR
Anion gap: 12 (ref 5–15)
BUN: 16 mg/dL (ref 6–20)
CO2: 24 mmol/L (ref 22–32)
Calcium: 9.6 mg/dL (ref 8.9–10.3)
Chloride: 102 mmol/L (ref 98–111)
Creatinine, Ser: 0.63 mg/dL (ref 0.44–1.00)
GFR, Estimated: >60 mL/min (ref >60)
Glucose, Bld: 124 mg/dL — ABNORMAL HIGH (ref 70–99)
Potassium: 3.9 mmol/L (ref 3.5–5.1)
Sodium: 138 mmol/L (ref 135–145)

## 2024-01-22 LAB — CBC
HCT: 39.7 % (ref 36.0–46.0)
Hemoglobin: 12 g/dL (ref 12.0–15.0)
MCH: 24.7 pg — ABNORMAL LOW (ref 26.0–34.0)
MCHC: 30.2 g/dL (ref 30.0–36.0)
MCV: 81.7 fL (ref 80.0–100.0)
Platelets: 328 K/uL (ref 150–400)
RBC: 4.86 MIL/uL (ref 3.87–5.11)
RDW: 14.7 % (ref 11.5–15.5)
WBC: 10.7 K/uL — ABNORMAL HIGH (ref 4.0–10.5)
nRBC: 0 % (ref 0.0–0.2)

## 2024-01-22 NOTE — ED Triage Notes (Addendum)
 Patient has left upper arm pain that wraps around her armpit and upper back moving to her upper neck. This began yesterday. Does not think she pulled a muscle.

## 2024-01-23 ENCOUNTER — Telehealth: Payer: Self-pay

## 2024-02-09 ENCOUNTER — Emergency Department (HOSPITAL_COMMUNITY)
Admission: EM | Admit: 2024-02-09 | Discharge: 2024-02-09 | Disposition: A | Discharge status: Home / Self Care | Source: Ambulatory Visit | Attending: Emergency Medicine | Admitting: Emergency Medicine

## 2024-02-09 ENCOUNTER — Encounter (HOSPITAL_COMMUNITY): Payer: Self-pay | Admitting: Emergency Medicine

## 2024-02-09 ENCOUNTER — Other Ambulatory Visit: Payer: Self-pay

## 2024-02-09 ENCOUNTER — Emergency Department (HOSPITAL_COMMUNITY)

## 2024-02-09 DIAGNOSIS — M545 Low back pain, unspecified: Secondary | ICD-10-CM | POA: Insufficient documentation

## 2024-02-09 DIAGNOSIS — M5410 Radiculopathy, site unspecified: Secondary | ICD-10-CM

## 2024-02-09 DIAGNOSIS — S39012A Strain of muscle, fascia and tendon of lower back, initial encounter: Secondary | ICD-10-CM

## 2024-02-09 DIAGNOSIS — M546 Pain in thoracic spine: Secondary | ICD-10-CM | POA: Diagnosis present

## 2024-02-09 LAB — POC URINE PREG, ED: Preg Test, Ur: NEGATIVE

## 2024-02-09 MED ORDER — LIDOCAINE 5 % EX PTCH
1.0000 | MEDICATED_PATCH | CUTANEOUS | Status: DC
  Administered 2024-02-09: 1 via TRANSDERMAL
  Filled 2024-02-09: qty 1

## 2024-02-09 NOTE — ED Provider Notes (Signed)
 [place] EMERGENCY DEPARTMENT AT [facility] Provider Note   CSN: [id number] Arrival date & time: 02/09/24  9184     Patient presents with: Back Pain   Kelsey [name] is a 33 y.o. female.   HPI   33 year old female presents emergency department complaining of mid upper and lower back pain.  This has been ongoing for couple weeks.  Initially was left upper back pain wrapping around to her chest, then became right sided wrapping around to her chest.  She has been seen as an outpatient through urgent care and her primary doctor.  She has failed NSAIDs, prednisone and muscle relaxer.  Comes in today with pain now developing in the lower back going down the left leg.  She describes it as sharp, shocklike, sometimes in the left buttocks and mainly down the left thigh.  She endorses weakness secondary to pain but denies any numbness.  No saddle anesthesia, no trauma, no difficulty with bowel or bladder.  No fever.  Denies any injectable drug use.  Prior to Admission medications   Medication Sig Start Date End Date Taking? Authorizing Provider  acetaminophen  (TYLENOL ) 500 MG tablet Take 500 mg by mouth every 6 (six) hours as needed for fever, headache or mild pain.    [provider]  Cholecalciferol 50 MCG (2000 UT) CAPS  01/04/22   [provider]  Docusate Sodium  (COLACE PO) Take 1 tablet by mouth daily.    [provider]  ferrous sulfate  325 (65 FE) MG tablet Take 325 mg by mouth daily with breakfast.    [provider]  Prenatal Vit-Fe Fumarate-FA (MULTIVITAMIN-PRENATAL) 27-0.8 MG TABS tablet Take 1 tablet by mouth daily at 12 noon.    [provider]    Allergies: No known allergies    Review of Systems  Constitutional:  Negative for fever.  Respiratory:  Negative for shortness of breath.   Cardiovascular:  Negative for chest pain.  Gastrointestinal:  Negative for abdominal pain, diarrhea and vomiting.  Musculoskeletal:   Positive for back pain.       +LLE pain  Skin:  Negative for rash.  Neurological:  Negative for numbness and headaches.    Updated Vital Signs BP 130/80 (BP Location: Left Arm)   Pulse (!) 108   Temp 97.9 F (36.6 C) (Oral)   Resp 18   LMP 01/14/2024 (Approximate)   SpO2 100%   Physical Exam Vitals and nursing note reviewed.  Constitutional:      General: She is not in acute distress.    Appearance: Normal appearance.  HENT:     Head: Normocephalic.     Mouth/Throat:     Mouth: Mucous membranes are moist.  Cardiovascular:     Rate and Rhythm: Normal rate.  Pulmonary:     Effort: Pulmonary effort is normal. No respiratory distress.  Abdominal:     Palpations: Abdomen is soft.     Tenderness: There is no abdominal tenderness.  Musculoskeletal:     Cervical back: Normal range of motion and neck supple. No rigidity.     Comments: Midline spinal tenderness around T9/10 as well as around L3 area.  Lateral paraspinal muscular tenderness to palpation  Skin:    General: Skin is warm.  Neurological:     Mental Status: She is alert and oriented to person, place, and time. Mental status is at baseline.     Comments: She has intact strength in the lower extremities but fatigues/weak on the left side secondary  to pain.  Reflexes intact.  Psychiatric:        Mood and Affect: Mood normal.     (all labs ordered are listed, but only abnormal results are displayed) Labs Reviewed  POC URINE PREG, ED    EKG: None  Radiology: No results found.   Procedures   Medications Ordered in the ED - No data to display                                  Medical Decision Making Amount and/or Complexity of Data Reviewed Radiology: ordered.  Risk Prescription drug management.   33 year old female presents emergency ferment with left back mainly in the buttocks going down the left leg.  Vitals are stable on my evaluation, she is neurologically intact, laying in the chair stretcher  comfortably.  She has a positive straight leg test on the left, otherwise is neurovascularly intact.  CT of the thoracic and lumbar spine shows no acute finding.  I do believe that this is mostly musculoskeletal, possible sciatica.  Patient has failed NSAIDs and muscle relaxers.  She did not take her prednisone regimen as previously prescribed so I recommended adding this to her regimen.  Otherwise I have placed a referral to neurosurgery for further evaluation and treatment.  No other red flags or indications for emergent MRI or further testing at this time.  Will also add Lidoderm  patches to therapy.  Patient at this time appears safe and stable for discharge and close outpatient follow up. Discharge plan and strict return to ED precautions discussed, patient verbalizes understanding and agreement.     Final diagnoses:  None    ED Discharge Orders     None          Bari Roxie HERO, DO 02/09/24 1652

## 2024-02-09 NOTE — ED Triage Notes (Signed)
 Pt reports lower back pain that radiates to both legs. Pt reports the pain is worse in the left leg causing her to be unable to walk. Denies loss of bowel or bladder. Denies injury. Pt reports having 2 x-rays of her back that were normal.

## 2024-02-09 NOTE — Discharge Instructions (Signed)
 You have been seen and discharged from the emergency department.  Your CAT scan imaging was normal.  I believe that you are suffering from sciatica.  Take the prednisone regimen that had previously been prescribed to you.  Use Lidoderm  patches as directed.  Take Tylenol  and stay well-hydrated.  Ambulatory referral to neurosurgery has been placed, you may call the office for further scheduling.  Follow-up with your primary provider for further evaluation and further care. Take home medications as prescribed. If you have any worsening symptoms, fevers, numbness of your inner thighs, difficulty peeing/pooping or further concerns for your health please return to an emergency department for further evaluation.

## 2024-02-27 NOTE — Progress Notes (Deleted)
 Referring Physician:  [facility], [facility] [address] [place],  [place] [address]  Primary Physician:  [facility]  History of Present Illness: 02/27/2024 Ms. Kelsey [name] is here today with a chief complaint of ***  Strain of lumbar region  Pain lower back going down the left leg  Pain sometimes in left buttocks and mainly down the left thigh, with weakness.   Duration: *** Location: *** Quality: *** Severity: ***  Precipitating: aggravated by *** Modifying factors: made better by *** Weakness: none Timing: *** Bowel/Bladder Dysfunction: none  Conservative measures:  Physical therapy: *** Has not participated in? Multimodal medical therapy including regular antiinflammatories: *** prednisone, muscle relaxer, Lidoderm  patches, Tylenol   Injections: *** epidural steroid injections?  Past Surgery: ***  Kelsey [name] has ***no symptoms of cervical myelopathy.  The symptoms are causing a significant impact on the patient's life.   Review of Systems:  A 10 point review of systems is negative, except for the pertinent positives and negatives detailed in the HPI.  Past Medical History: Past Medical History:  Diagnosis Date   Anemia    Postpartum endometritis 12/06/2016   Rheumatic fever     Past Surgical History: Past Surgical History:  Procedure Laterality Date   TONSILLECTOMY      Allergies: Allergies as of 03/05/2024 - Review Complete 02/09/2024  Allergen Reaction Noted   No known allergies      Medications: Outpatient Encounter Medications as of 03/05/2024  Medication Sig   acetaminophen  (TYLENOL ) 500 MG tablet Take 500 mg by mouth every 6 (six) hours as needed for fever, headache or mild pain.   Cholecalciferol 50 MCG (2000 UT) CAPS    Docusate Sodium  (COLACE PO) Take 1 tablet by mouth daily.   ferrous sulfate  325 (65 FE) MG tablet Take 325 mg by mouth daily with breakfast.   Prenatal Vit-Fe Fumarate-FA (MULTIVITAMIN-PRENATAL)  27-0.8 MG TABS tablet Take 1 tablet by mouth daily at 12 noon.   No facility-administered encounter medications on file as of 03/05/2024.    Social History: Social History   Tobacco Use   Smoking status: Never   Smokeless tobacco: Never  Vaping Use   Vaping status: Never Used  Substance Use Topics   Alcohol use: No   Drug use: No    Family Medical History: Family History  Problem Relation Age of Onset   Hypertension Mother    Hypertension Father     Physical Examination: @VITALWITHPAIN @  General: Patient is well developed, well nourished, calm, collected, and in no apparent distress. Attention to examination is appropriate.  Psychiatric: Patient is non-anxious.  Head:  Pupils equal, round, and reactive to light.  ENT:  Oral mucosa appears well hydrated.  Neck:   Supple.  ***Full range of motion.  Respiratory: Patient is breathing without any difficulty.  Extremities: No edema.  Vascular: Palpable dorsal pedal pulses.  Skin:   On exposed skin, there are no abnormal skin lesions.  NEUROLOGICAL:     Awake, alert, oriented to person, place, and time.  Speech is clear and fluent. Fund of knowledge is appropriate.   Cranial Nerves: Pupils equal round and reactive to light.  Facial tone is symmetric.  Facial sensation is symmetric.  ROM of spine: ***full.  Palpation of spine: ***non tender.    Strength: Side Biceps Triceps Deltoid Interossei Grip Wrist Ext. Wrist Flex.  R 5 5 5 5 5 5 5   L 5 5 5 5 5 5 5    Side Iliopsoas Quads Hamstring PF  DF EHL  R 5 5 5 5 5 5   L 5 5 5 5 5 5    Reflexes are ***2+ and symmetric at the biceps, triceps, brachioradialis, patella and achilles.   Hoffman's is absent.  Clonus is not present.  Toes are down-going.  Bilateral upper and lower extremity sensation is intact to light touch.    Gait is normal.   No difficulty with tandem gait.   No evidence of dysmetria noted.  Medical Decision Making  Imaging: ***  I have  personally reviewed the images and agree with the above interpretation.  Assessment and Plan: [name] is a pleasant 33 y.o. female with ***    Thank you for involving me in the care of this patient.   I spent a total of *** minutes in both face-to-face and non-face-to-face activities for this visit on the date of this encounter.   Lyle Decamp, PA-C Dept. of Neurosurgery

## 2024-03-05 ENCOUNTER — Ambulatory Visit: Admitting: Physician Assistant
